# Patient Record
Sex: Male | Born: 1990 | Race: White | Hispanic: No | Marital: Single | Smoking: Never smoker
Health system: Southern US, Community
[De-identification: ages and names within clinical notes are randomized; demographics above are authoritative.]

## PROBLEM LIST (undated history)

## (undated) DIAGNOSIS — E291 Testicular hypofunction: Secondary | ICD-10-CM

## (undated) DIAGNOSIS — R7989 Other specified abnormal findings of blood chemistry: Secondary | ICD-10-CM

## (undated) DIAGNOSIS — T7840XA Allergy, unspecified, initial encounter: Secondary | ICD-10-CM

## (undated) DIAGNOSIS — K200 Eosinophilic esophagitis: Secondary | ICD-10-CM

## (undated) DIAGNOSIS — K219 Gastro-esophageal reflux disease without esophagitis: Secondary | ICD-10-CM

## (undated) DIAGNOSIS — J45909 Unspecified asthma, uncomplicated: Secondary | ICD-10-CM

## (undated) DIAGNOSIS — Z973 Presence of spectacles and contact lenses: Secondary | ICD-10-CM

## (undated) DIAGNOSIS — I10 Essential (primary) hypertension: Secondary | ICD-10-CM

## (undated) HISTORY — DX: Allergy, unspecified, initial encounter: T78.40XA

## (undated) HISTORY — DX: Essential (primary) hypertension: I10

## (undated) HISTORY — DX: Other specified abnormal findings of blood chemistry: R79.89

## (undated) HISTORY — DX: Testicular hypofunction: E29.1

## (undated) HISTORY — DX: Gastro-esophageal reflux disease without esophagitis: K21.9

## (undated) HISTORY — DX: Unspecified asthma, uncomplicated: J45.909

## (undated) HISTORY — DX: Presence of spectacles and contact lenses: Z97.3

## (undated) HISTORY — DX: Eosinophilic esophagitis: K20.0

---

## 2014-07-05 DIAGNOSIS — R7989 Other specified abnormal findings of blood chemistry: Secondary | ICD-10-CM

## 2014-07-05 HISTORY — DX: Other specified abnormal findings of blood chemistry: R79.89

## 2015-07-06 DIAGNOSIS — I10 Essential (primary) hypertension: Secondary | ICD-10-CM

## 2015-07-06 HISTORY — DX: Essential (primary) hypertension: I10

## 2015-08-12 ENCOUNTER — Emergency Department (HOSPITAL_COMMUNITY): Payer: BLUE CROSS/BLUE SHIELD

## 2015-08-12 ENCOUNTER — Encounter (HOSPITAL_COMMUNITY): Payer: Self-pay | Admitting: Cardiology

## 2015-08-12 ENCOUNTER — Emergency Department (HOSPITAL_COMMUNITY)
Admission: EM | Admit: 2015-08-12 | Discharge: 2015-08-12 | Disposition: A | Discharge status: Home / Self Care | Payer: BLUE CROSS/BLUE SHIELD | Attending: Emergency Medicine | Admitting: Emergency Medicine

## 2015-08-12 DIAGNOSIS — Z880 Allergy status to penicillin: Secondary | ICD-10-CM | POA: Insufficient documentation

## 2015-08-12 DIAGNOSIS — R002 Palpitations: Secondary | ICD-10-CM | POA: Diagnosis not present

## 2015-08-12 DIAGNOSIS — R079 Chest pain, unspecified: Secondary | ICD-10-CM | POA: Diagnosis present

## 2015-08-12 DIAGNOSIS — R200 Anesthesia of skin: Secondary | ICD-10-CM | POA: Diagnosis not present

## 2015-08-12 LAB — I-STAT TROPONIN, ED: Troponin i, poc: 0 ng/mL (ref 0.00–0.08)

## 2015-08-12 LAB — CBC
HEMATOCRIT: 52.3 % — AB (ref 39.0–52.0)
HEMOGLOBIN: 18.1 g/dL — AB (ref 13.0–17.0)
MCH: 28.9 pg (ref 26.0–34.0)
MCHC: 34.6 g/dL (ref 30.0–36.0)
MCV: 83.5 fL (ref 78.0–100.0)
Platelets: 193 10*3/uL (ref 150–400)
RBC: 6.26 MIL/uL — ABNORMAL HIGH (ref 4.22–5.81)
RDW: 12.5 % (ref 11.5–15.5)
WBC: 5.7 10*3/uL (ref 4.0–10.5)

## 2015-08-12 LAB — BASIC METABOLIC PANEL
ANION GAP: 13 (ref 5–15)
BUN: 12 mg/dL (ref 6–20)
CHLORIDE: 101 mmol/L (ref 101–111)
CO2: 26 mmol/L (ref 22–32)
Calcium: 9.9 mg/dL (ref 8.9–10.3)
Creatinine, Ser: 1.11 mg/dL (ref 0.61–1.24)
GFR calc Af Amer: >60 mL/min (ref >60)
GLUCOSE: 108 mg/dL — AB (ref 65–99)
POTASSIUM: 3.4 mmol/L — AB (ref 3.5–5.1)
SODIUM: 140 mmol/L (ref 135–145)

## 2015-08-12 MED ORDER — POTASSIUM CHLORIDE CRYS ER 20 MEQ PO TBCR
40.0000 meq | EXTENDED_RELEASE_TABLET | Freq: Once | ORAL | Status: AC
Start: 2015-08-12 — End: 2015-08-12
  Administered 2015-08-12: 40 meq via ORAL
  Filled 2015-08-12: qty 2

## 2015-08-12 NOTE — ED Notes (Signed)
Pt is in stable condition upon d/c and ambulates from ED. 

## 2015-08-12 NOTE — ED Provider Notes (Signed)
CSN: [id number]     Arrival date & time 08/12/15  4098 History   First MD Initiated Contact with Patient 08/12/15 [phone]     Chief Complaint  Patient presents with  . Chest Pain  . Numbness     (Consider location/radiation/quality/duration/timing/severity/associated sxs/prior Treatment) HPI  Pt presenting with c/o heart racing associated with chest pain.  He states he was getting ready for work this morning as usual- upon getting out of the shower he felt his heart racing. He states he became very anxious and wasn't able to talk.  He describes chest pain and radiation down arm.  He states he has had similar symptoms multiple times over the past several weeks.  No fainting.  No leg swelling.  Symptoms are not associated with exertion.  No hx of DVT/PE.  No recent travel/trauma/surgery.  There are no other associated systemic symptoms, there are no other alleviating or modifying factors.   History reviewed. No pertinent past medical history. History reviewed. No pertinent past surgical history. History reviewed. No pertinent family history. Social History  Substance Use Topics  . Smoking status: Never Smoker   . Smokeless tobacco: None  . Alcohol Use: Yes    Review of Systems  ROS reviewed and all otherwise negative except for mentioned in HPI    Allergies  Penicillins and Proton pump inhibitors  Home Medications   Prior to Admission medications   Not on File   BP 146/90 mmHg  Pulse 71  Temp(Src) 98.2 F (36.8 C) (Oral)  Resp 17  SpO2 98%  Vitals reviewed Physical Exam  Physical Examination: General appearance - alert, well appearing, and in no distress Mental status - alert, oriented to person, place, and time Eyes - no conjunctival injection, no scleral icterus Mouth - mucous membranes moist, pharynx normal without lesions Chest - clear to auscultation, no wheezes, rales or rhonchi, symmetric air entry Heart - normal rate, regular rhythm, normal S1, S2, no murmurs,  rubs, clicks or gallops Abdomen - soft, nontender, nondistended, no masses or organomegaly Neurological - alert, oriented, normal speech Extremities - peripheral pulses normal, no pedal edema, no clubbing or cyanosis Skin - normal coloration and turgor, no rashes  ED Course  Procedures (including critical care time) Labs Review Labs Reviewed  BASIC METABOLIC PANEL - Abnormal; Notable for the following:    Potassium 3.4 (*)    Glucose, Bld 108 (*)    All other components within normal limits  CBC - Abnormal; Notable for the following:    RBC 6.26 (*)    Hemoglobin 18.1 (*)    HCT 52.3 (*)    All other components within normal limits  I-STAT TROPOININ, ED    Imaging Review No results found. I have personally reviewed and evaluated these images and lab results as part of my medical decision-making.   EKG Interpretation   Date/Time:  Tuesday August 12 2015 08:40:57 EST Ventricular Rate:  80 PR Interval:  144 QRS Duration: 88 QT Interval:  368 QTC Calculation: 424 R Axis:   62 Text Interpretation:  Normal sinus rhythm Normal ECG No old tracing to  compare Confirmed by [facility]  MD, Khamille Beynon [phone]) on 08/12/2015 9:35:30 AM      MDM   Final diagnoses:  Palpitations  Chest pain, unspecified chest pain type    Pt presenting with c/o palpitations- heart racing this morning.  Workup reassuring inlcuding labs/cxr.  Pt given information for cardiology followup.  Discharged with strict return precautions.  Pt agreeable with  plan.    Jerelyn Scott, MD 08/15/15 [phone]

## 2015-08-12 NOTE — ED Notes (Signed)
Pt reports left sided chest pain and arm numbness that started a couple of days ago. Reports some SOB with the pain.

## 2015-08-12 NOTE — Discharge Instructions (Signed)
Return to the ED with any concerns including difficulty breathing, fainting, leg swelling, vomiting and not able to keep down liquids, or any other alarming symptoms

## 2015-10-08 ENCOUNTER — Encounter: Payer: Self-pay | Admitting: Medical

## 2015-10-08 ENCOUNTER — Ambulatory Visit (INDEPENDENT_AMBULATORY_CARE_PROVIDER_SITE_OTHER): Payer: 59 | Admitting: Medical

## 2015-10-08 VITALS — BP 146/108 | HR 73 | Ht 68.5 in | Wt 233.0 lb

## 2015-10-08 DIAGNOSIS — Z79899 Other long term (current) drug therapy: Secondary | ICD-10-CM | POA: Diagnosis not present

## 2015-10-08 DIAGNOSIS — E291 Testicular hypofunction: Secondary | ICD-10-CM

## 2015-10-08 DIAGNOSIS — K219 Gastro-esophageal reflux disease without esophagitis: Secondary | ICD-10-CM | POA: Diagnosis not present

## 2015-10-08 DIAGNOSIS — R030 Elevated blood-pressure reading, without diagnosis of hypertension: Secondary | ICD-10-CM

## 2015-10-08 DIAGNOSIS — R718 Other abnormality of red blood cells: Secondary | ICD-10-CM | POA: Diagnosis not present

## 2015-10-08 DIAGNOSIS — E559 Vitamin D deficiency, unspecified: Secondary | ICD-10-CM

## 2015-10-08 DIAGNOSIS — R079 Chest pain, unspecified: Secondary | ICD-10-CM

## 2015-10-08 DIAGNOSIS — R7989 Other specified abnormal findings of blood chemistry: Secondary | ICD-10-CM

## 2015-10-08 LAB — CBC WITH DIFFERENTIAL/PLATELET
BASOS ABS: 0 {cells}/uL (ref 0–200)
Basophils Relative: 0 %
EOS ABS: 212 {cells}/uL (ref 15–500)
EOS PCT: 4 %
HCT: 49 % (ref 38.5–50.0)
Hemoglobin: 16.7 g/dL (ref 13.2–17.1)
LYMPHS PCT: 34 %
Lymphs Abs: 1802 cells/uL (ref 850–3900)
MCH: 28.4 pg (ref 27.0–33.0)
MCHC: 34.1 g/dL (ref 32.0–36.0)
MCV: 83.5 fL (ref 80.0–100.0)
MONOS PCT: 8 %
MPV: 9.5 fL (ref 7.5–12.5)
Monocytes Absolute: 424 cells/uL (ref 200–950)
NEUTROS ABS: 2862 {cells}/uL (ref 1500–7800)
NEUTROS PCT: 54 %
PLATELETS: 220 10*3/uL (ref 140–400)
RBC: 5.87 MIL/uL — ABNORMAL HIGH (ref 4.20–5.80)
RDW: 13.1 % (ref 11.0–15.0)
WBC: 5.3 10*3/uL (ref 4.0–10.5)

## 2015-10-08 LAB — HEPATITIS C ANTIBODY: HCV AB: NEGATIVE

## 2015-10-08 LAB — COMPREHENSIVE METABOLIC PANEL
ALK PHOS: 89 U/L (ref 40–115)
ALT: 21 U/L (ref 9–46)
AST: 24 U/L (ref 10–40)
Albumin: 4.9 g/dL (ref 3.6–5.1)
BILIRUBIN TOTAL: 1.1 mg/dL (ref 0.2–1.2)
BUN: 19 mg/dL (ref 7–25)
CALCIUM: 9.5 mg/dL (ref 8.6–10.3)
CO2: 29 mmol/L (ref 20–31)
CREATININE: 1.08 mg/dL (ref 0.60–1.35)
Chloride: 102 mmol/L (ref 98–110)
GLUCOSE: 80 mg/dL (ref 65–99)
Potassium: 3.9 mmol/L (ref 3.5–5.3)
SODIUM: 138 mmol/L (ref 135–146)
Total Protein: 8.2 g/dL — ABNORMAL HIGH (ref 6.1–8.1)

## 2015-10-08 LAB — HIV ANTIBODY (ROUTINE TESTING W REFLEX): HIV 1&2 Ab, 4th Generation: NONREACTIVE

## 2015-10-08 LAB — TESTOSTERONE: TESTOSTERONE: 243 ng/dL — AB (ref 250–827)

## 2015-10-08 LAB — HEMOGLOBIN A1C
Hgb A1c MFr Bld: 5.6 % (ref <5.7)
Mean Plasma Glucose: 114 mg/dL

## 2015-10-08 LAB — HEPATITIS B SURFACE ANTIGEN: Hepatitis B Surface Ag: NEGATIVE

## 2015-10-08 NOTE — Progress Notes (Signed)
Subjective: Chief Complaint  Patient presents with  . New Patient (Initial Visit)    also wants to discuss vit d medicine and wants blood work is not fasting.    Here as a new patient today to establish care.  [place] in [place] to [place].  Had low Vit D at  [facility] in [place] a month ago, been on the prescription Vit D weekly, but ran out.   He originally went to urgent care for chest pain following an illness, had labs, EKG was normal, was advised he has panic attacks, anxiety, labs were done, showing low Vit D, and put on the weekly Vit D.   Since then has improvement in energy.   In recent weeks doing fine.  Is outgoing, doesn't feel like he has anxiety.  BPs have been elevated in the past, but then will be normal on recheck.  BPs can be all over the place.  Engaged, considering getting married 12/2015.  Has been together 5 years.   8 years ago had crazy girlfriend, and he ended going on antidepressants for 2 months.  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 No other aggravating or relieving factors. No other complaint.  History reviewed. No pertinent past medical history.  ROS as in subjective   Objective: BP 146/108 mmHg  Pulse 73  Ht 5' 8.5" (1.74 m)  Wt 233 lb (105.688 kg)  BMI 34.91 kg/m2  Gen: wd, wn, nad, white male Skin: warm, dry, freckles, no jaundice Oral cavity: MMM, no lesions Neck: supple, no lymphadenopathy, no thyromegaly, no masses, no bruits Heart: RRR, normal S1, S2, no murmurs Lungs: CTA bilaterally, no wheezes, rhonchi, or rales Abdomen: +bs, soft, non tender, non distended, no masses, no hepatomegaly, no splenomegaly, no bruits Pulses: 2+ symmetric, upper and lower extremities, normal cap refill Ext: no edema    Assessment: Encounter Diagnoses  Name Primary?  . Elevated blood-pressure reading without diagnosis of hypertension Yes  . Chest pain, unspecified chest pain type   . Gastroesophageal  reflux disease without esophagitis   . RBC abnormality   . Vitamin D deficiency   . High risk medication use   . Low testosterone     Plan: Elevated BPs - advised home monitoring, and return BP numbers in 2 wk.  Discussed risks of uncontrolled hpyertension Chest pain - resolved, and he does have hx/o GERD which could contribute to his pain GERD - long standing x 3+ years. C/t ranitidine, avoid trigger foods RBC abnormality in 08/2015.  Plan to repeat labs  Vit D deficiency - plan to repeat labs He has hx/o unprescribed use of TST injections over a year ago.   advised screening and labs to rule out acquired infection F/u pending labs.   Apolinar JunesBrandon was seen today for new patient (initial visit).  Diagnoses and all orders for this visit:  Elevated blood-pressure reading without diagnosis of hypertension -     Comprehensive metabolic panel -     CBC with Differential/Platelet -     Hemoglobin A1c  Chest pain, unspecified chest pain type  Gastroesophageal reflux disease without esophagitis  RBC abnormality -     CBC with Differential/Platelet  Vitamin D deficiency -     VITAMIN D 25 Hydroxy (Vit-D Deficiency, Fractures)  High risk medication use -     Comprehensive metabolic panel -     CBC with Differential/Platelet -     GC/Chlamydia Probe Amp -  HIV antibody -     RPR -     Hepatitis B surface antigen -     Hepatitis C antibody  Low testosterone -     Testosterone

## 2015-10-09 LAB — RPR

## 2015-10-09 LAB — GC/CHLAMYDIA PROBE AMP
CT Probe RNA: NOT DETECTED
GC PROBE AMP APTIMA: NOT DETECTED

## 2015-10-09 LAB — VITAMIN D 25 HYDROXY (VIT D DEFICIENCY, FRACTURES): VIT D 25 HYDROXY: 40 ng/mL (ref 30–100)

## 2015-10-14 ENCOUNTER — Encounter: Payer: Self-pay | Admitting: Medical

## 2015-10-22 ENCOUNTER — Encounter: Payer: Self-pay | Admitting: Medical

## 2015-10-22 ENCOUNTER — Ambulatory Visit (INDEPENDENT_AMBULATORY_CARE_PROVIDER_SITE_OTHER): Payer: 59 | Admitting: Medical

## 2015-10-22 VITALS — BP 130/90 | HR 75 | Wt 231.0 lb

## 2015-10-22 DIAGNOSIS — D751 Secondary polycythemia: Secondary | ICD-10-CM

## 2015-10-22 DIAGNOSIS — R7989 Other specified abnormal findings of blood chemistry: Secondary | ICD-10-CM

## 2015-10-22 DIAGNOSIS — I10 Essential (primary) hypertension: Secondary | ICD-10-CM | POA: Diagnosis not present

## 2015-10-22 DIAGNOSIS — E291 Testicular hypofunction: Secondary | ICD-10-CM | POA: Diagnosis not present

## 2015-10-22 MED ORDER — LISINOPRIL 10 MG PO TABS: 10.0000 mg | ORAL_TABLET | Freq: Every day | ORAL | Status: DC

## 2015-10-22 NOTE — Progress Notes (Signed)
Subjective: Chief Complaint  Patient presents with  . Follow-up    states bp is still high here are his readings: 139/90 - Friday 4/7 133/89 - Saturday 4/8 126/82 - Sunday 4/9  126/78 - Monday 4/10    Here to follow up from last visit.   At his recent visit here BPs were elevated.  At that visit we asked him to monitor BPs which he has been doing and getting normal to mildlhy elevated readings.  However, he does get dizzy at times, gets fatigue at times.  Thinks its related to high blood pressure.  His father was diagnosed with hypertension at age 24yo, grandfather with HTN also at an early age.  Mother has hx/o OSA   He is here to discussed the recent labs including elevated RBCs, low testosterone.  No other aggravating or relieving factors. No other complaint.  Past Medical History  Diagnosis Date  . Asthma     childhood  . Wears glasses   . Allergy     ROS as in subjective   Objective: BP 130/90 mmHg  Pulse 75  Wt 231 lb (104.781 kg)  Gen: wd, wn, nad, white male Skin: warm, dry, freckles, no jaundice Oral cavity: MMM, no lesions Neck: supple, no lymphadenopathy, no thyromegaly, no masses, no bruits Heart: RRR, normal S1, S2, no murmurs Lungs: CTA bilaterally, no wheezes, rhonchi, or rales Abdomen: +bs, soft, non tender, non distended, no masses, no hepatomegaly, no splenomegaly, no bruits Pulses: 2+ symmetric, upper and lower extremities, normal cap refill Ext: no edema    Assessment: Encounter Diagnoses  Name Primary?  . Essential hypertension Yes  . Erythrocytosis   . Low testosterone      Plan: New diagnosis of hypertension.  discussed continuing home monitoring of blood pressures, begin medication below, avoid salt, and work on weight loss theory healthy diet and exercise  erythrocytosis - recheck in 1-2 mo  Low T - discussed findings, but also defer for now until BP is under better    Peter Chase was seen today for follow-up.  Diagnoses and all orders for  this visit:  Essential hypertension  Erythrocytosis  Low testosterone  Other orders -     lisinopril (PRINIVIL,ZESTRIL) 10 MG tablet; Take 1 tablet (10 mg total) by mouth daily.

## 2015-11-05 ENCOUNTER — Telehealth: Payer: Self-pay | Admitting: Medical

## 2015-11-05 NOTE — Telephone Encounter (Signed)
Received a call from Dr. Ethelene BrownsAnthony at [facility] concerning records request we have sent per pt's instructions. They state they do not have a patient in that practice by that name. No records available.

## 2015-11-07 ENCOUNTER — Encounter: Payer: Self-pay | Admitting: Medical

## 2015-11-07 ENCOUNTER — Telehealth: Payer: Self-pay | Admitting: Medical

## 2015-11-07 NOTE — Telephone Encounter (Signed)
Pt has not been checking bp and is going to start. Stated he will call back to make appt

## 2015-11-07 NOTE — Telephone Encounter (Signed)
I received a message from him.   See if he is actually checking his BPs.  If not, needs to check BP several days per week or anytime he feels dizzy, lightheaded.   He is on a low dose of Lisinopril, but if he is seeing readings 100/60 or lower, then that is too low.  Monitor BP, and lets plan recheck in a few weeks, but bring BP readings with him.

## 2015-11-07 NOTE — Telephone Encounter (Signed)
LMTCB

## 2015-11-19 ENCOUNTER — Ambulatory Visit (INDEPENDENT_AMBULATORY_CARE_PROVIDER_SITE_OTHER): Payer: 59 | Admitting: Medical

## 2015-11-19 VITALS — BP 116/80 | HR 71 | Wt 230.0 lb

## 2015-11-19 DIAGNOSIS — D751 Secondary polycythemia: Secondary | ICD-10-CM

## 2015-11-19 DIAGNOSIS — E291 Testicular hypofunction: Secondary | ICD-10-CM

## 2015-11-19 DIAGNOSIS — R5382 Chronic fatigue, unspecified: Secondary | ICD-10-CM

## 2015-11-19 DIAGNOSIS — I10 Essential (primary) hypertension: Secondary | ICD-10-CM

## 2015-11-19 LAB — CBC WITH DIFFERENTIAL/PLATELET
Basophils Absolute: 0 cells/uL (ref 0–200)
Basophils Relative: 0 %
EOS ABS: 224 {cells}/uL (ref 15–500)
Eosinophils Relative: 4 %
HEMATOCRIT: 49.5 % (ref 38.5–50.0)
Hemoglobin: 16.9 g/dL (ref 13.2–17.1)
LYMPHS PCT: 41 %
Lymphs Abs: 2296 cells/uL (ref 850–3900)
MCH: 28.3 pg (ref 27.0–33.0)
MCHC: 34.1 g/dL (ref 32.0–36.0)
MCV: 82.9 fL (ref 80.0–100.0)
MONO ABS: 448 {cells}/uL (ref 200–950)
MPV: 9.3 fL (ref 7.5–12.5)
Monocytes Relative: 8 %
NEUTROS PCT: 47 %
Neutro Abs: 2632 cells/uL (ref 1500–7800)
Platelets: 239 10*3/uL (ref 140–400)
RBC: 5.97 MIL/uL — ABNORMAL HIGH (ref 4.20–5.80)
RDW: 13.4 % (ref 11.0–15.0)
WBC: 5.6 10*3/uL (ref 4.0–10.5)

## 2015-11-19 MED ORDER — LISINOPRIL 10 MG PO TABS: 10.0000 mg | ORAL_TABLET | Freq: Every day | ORAL | Status: DC

## 2015-11-19 NOTE — Addendum Note (Signed)
Addended by: Jac CanavanYSINGER, DAVID S on: 11/19/2015 04:24 PM   Modules accepted: Orders

## 2015-11-19 NOTE — Progress Notes (Signed)
Subjective: Chief Complaint  Patient presents with  . Follow-up    brought readings with him. states the dizziness has subsided. no problems or concerns.    Here for f/u.   I saw him about a month ago for HTN.  He had some initial dizziness, but this went away and now he is tolerating the medication fine, home BPs look great.   Compliant with medication  Wants to discuss testosterone . Has hx/o low T, was taking some friend's testosterone in remote past.  just as then he continues to have decreased energy levels.  Despite regular exercise, healthy diet, plenty of water intake, just feels drainage after exercise.  Is interested in pursuing testosterone therapy  Has elevated RBC in past labs.  Nonsmoker, no dyspnea.    Past Medical History  Diagnosis Date  . Asthma     childhood  . Wears glasses   . Allergy    ROS as in subjective   Objective: BP 116/80 mmHg  Pulse 71  Wt 230 lb (104.327 kg)  Gen: wd, wn, nad Heart: RRR, normal S1, S2, no murmurs Lungs clear Ext: no edema Pulses normal Normal male genitalia, circumcised, no mass, no testicles swelling, no lymphadenopathy DRE:  Anus normal tone, prostate WNL    Assessment: Encounter Diagnoses  Name Primary?  . Essential hypertension Yes  . Hypogonadism in male   . Chronic fatigue   . Erythrocytosis     Plan: HTN - much improved, c/t same medication  Hypogonadism, fatigue - repeat labs today, discussed diagnosis of low T, possible causes, treatment options, precautions.  Discussed this at length.  He will consider gel vs TST IM.   F/u pending labs.   erythrocytosis - repeat lab, likely transient.   F/u pending labs

## 2015-11-20 ENCOUNTER — Other Ambulatory Visit: Payer: Self-pay | Admitting: Medical

## 2015-11-20 LAB — FSH/LH
FSH: 2.9 m[IU]/mL (ref 1.6–8.0)
LH: 2.6 m[IU]/mL (ref 1.5–9.3)

## 2015-11-20 LAB — TESTOSTERONE: Testosterone: 242 ng/dL — ABNORMAL LOW (ref 250–827)

## 2015-11-20 LAB — TSH: TSH: 2.35 m[IU]/L (ref 0.40–4.50)

## 2015-11-20 LAB — PSA: PSA: 0.72 ng/mL (ref <=4.00)

## 2015-11-20 LAB — PROLACTIN: Prolactin: 7.4 ng/mL (ref 2.0–18.0)

## 2015-11-20 MED ORDER — TESTOSTERONE CYPIONATE 200 MG/ML IM SOLN: 200.0000 mg | INTRAMUSCULAR | Status: DC

## 2015-11-24 ENCOUNTER — Other Ambulatory Visit (INDEPENDENT_AMBULATORY_CARE_PROVIDER_SITE_OTHER): Payer: 59

## 2015-11-24 DIAGNOSIS — E291 Testicular hypofunction: Secondary | ICD-10-CM | POA: Diagnosis not present

## 2015-11-24 MED ORDER — TESTOSTERONE CYPIONATE 200 MG/ML IM SOLN
200.0000 mg | INTRAMUSCULAR | Status: DC
  Administered 2015-11-24: 200 mg via INTRAMUSCULAR

## 2015-11-25 ENCOUNTER — Encounter: Payer: Self-pay | Admitting: Medical

## 2015-12-05 ENCOUNTER — Other Ambulatory Visit (INDEPENDENT_AMBULATORY_CARE_PROVIDER_SITE_OTHER): Payer: 59

## 2015-12-05 DIAGNOSIS — E291 Testicular hypofunction: Secondary | ICD-10-CM

## 2015-12-05 DIAGNOSIS — R7989 Other specified abnormal findings of blood chemistry: Secondary | ICD-10-CM

## 2015-12-05 MED ORDER — "NEEDLE (DISP) 22G X 1-1/2"" MISC": 1.0000 | Status: DC

## 2015-12-05 MED ORDER — SYRINGE (DISPOSABLE) 1 ML MISC: 1.0000 | Status: DC

## 2015-12-05 MED ORDER — TESTOSTERONE CYPIONATE 200 MG/ML IM SOLN
200.0000 mg | Freq: Once | INTRAMUSCULAR | Status: AC
  Administered 2015-12-05: 200 mg via INTRAMUSCULAR

## 2015-12-09 ENCOUNTER — Ambulatory Visit (INDEPENDENT_AMBULATORY_CARE_PROVIDER_SITE_OTHER): Payer: 59 | Admitting: Medical

## 2015-12-09 ENCOUNTER — Encounter: Payer: Self-pay | Admitting: Medical

## 2015-12-09 VITALS — BP 126/80 | HR 96 | Temp 98.8°F | Wt 228.0 lb

## 2015-12-09 DIAGNOSIS — M791 Myalgia, unspecified site: Secondary | ICD-10-CM

## 2015-12-09 DIAGNOSIS — E291 Testicular hypofunction: Secondary | ICD-10-CM

## 2015-12-09 NOTE — Progress Notes (Signed)
Subjective: Chief Complaint  Patient presents with  . soreness    from TST shot in thigh. pt stated he is limping. no swelling or redness.    Here for right leg pain and soreness.  He came in Friday to work with CMA on self teaching for IM injection of testosterone at home.  Gave the injection here in right quadriceps laterally with 22 g 1.5 inch needle.  Since then has had soreness of muscle, hard to walk.  Denies redness, fever, numbness, tingling.  Today is day 5 after infection and still hurting.  ROS as in subjective  Objective: BP 126/80 mmHg  Pulse 96  Temp(Src) 98.8 F (37.1 C) (Tympanic)  Wt 228 lb (103.42 kg)  Gen: wd, wn, nad Tender over right mid vastus lateralis muscle over injection area but no induration, warmth, erythema.   Leg neurovascularly intact with normal ROM No swelling   Assessment: Encounter Diagnoses  Name Primary?  . Muscle pain Yes  . Hypogonadism in male     Plan: Advised warm compresses, OTC Ibuprofen, reassured.   Gave note for work and symptom should gradually resolve.  If not improved by Friday call back.  Advised he change to buttock upper outer 1/3 for future injections.  He voiced understanding of proper technique for home injections.   Advised f/u here for testosterone lab in 2- 3 weeks.

## 2016-01-21 ENCOUNTER — Encounter: Payer: Self-pay | Admitting: Medical

## 2016-02-05 ENCOUNTER — Encounter: Payer: Self-pay | Admitting: Medical

## 2016-02-16 ENCOUNTER — Other Ambulatory Visit (INDEPENDENT_AMBULATORY_CARE_PROVIDER_SITE_OTHER): Payer: 59

## 2016-02-16 DIAGNOSIS — R7989 Other specified abnormal findings of blood chemistry: Secondary | ICD-10-CM

## 2016-02-17 LAB — TESTOSTERONE: TESTOSTERONE: 772 ng/dL (ref 250–827)

## 2016-03-09 ENCOUNTER — Encounter: Payer: Self-pay | Admitting: Medical

## 2016-03-15 ENCOUNTER — Encounter: Payer: Self-pay | Admitting: Medical

## 2016-03-15 ENCOUNTER — Ambulatory Visit (INDEPENDENT_AMBULATORY_CARE_PROVIDER_SITE_OTHER): Payer: 59 | Admitting: Medical

## 2016-03-15 VITALS — BP 132/90 | HR 77 | Resp 16

## 2016-03-15 DIAGNOSIS — K219 Gastro-esophageal reflux disease without esophagitis: Secondary | ICD-10-CM | POA: Insufficient documentation

## 2016-03-15 DIAGNOSIS — G4710 Hypersomnia, unspecified: Secondary | ICD-10-CM

## 2016-03-15 DIAGNOSIS — R5382 Chronic fatigue, unspecified: Secondary | ICD-10-CM | POA: Diagnosis not present

## 2016-03-15 DIAGNOSIS — J309 Allergic rhinitis, unspecified: Secondary | ICD-10-CM | POA: Insufficient documentation

## 2016-03-15 DIAGNOSIS — E291 Testicular hypofunction: Secondary | ICD-10-CM | POA: Diagnosis not present

## 2016-03-15 DIAGNOSIS — R7989 Other specified abnormal findings of blood chemistry: Secondary | ICD-10-CM | POA: Insufficient documentation

## 2016-03-15 DIAGNOSIS — R400 Somnolence: Secondary | ICD-10-CM | POA: Insufficient documentation

## 2016-03-15 MED ORDER — MONTELUKAST SODIUM 10 MG PO TABS: 10.0000 mg | ORAL_TABLET | Freq: Every day | ORAL | 3 refills | Status: DC

## 2016-03-15 MED ORDER — TESTOSTERONE 50 MG/5GM (1%) TD GEL: 5.0000 g | Freq: Every day | TRANSDERMAL | 2 refills | Status: DC

## 2016-03-15 NOTE — Progress Notes (Signed)
Subjective Chief Complaint  Patient presents with  . testosterone    recheck levels. past test. inj 2 weeks ago (02/28/2016).    . Gastroesophageal Reflux    pt states he is having reflux that is disturbing his sleep.    Here for several concerns, mostly related to fatigue, low T and GERD.   Using TST injections every 14-20 days.  It does help with energy, but not so much daily, mostly the first week after injection.     Allergies- uses daily OTC zyrtec, but would like to try Singulair as this does well for his father.   Snores some, sleepy in the day, but no witnessed apnea.  Wakes up feeling mostly rested.  Mom has hx/o sleep apnea.  He has never had sleep study.  Lives with his fiance.  GERD - takes ranitidine, but still gets lots of chest discomfort and reflux, despite eating a "clean diet", avoiding triggers, taking ranitidine daily.    Past Medical History:  Diagnosis Date  . Allergy   . Asthma    childhood  . Wears glasses    Current Outpatient Prescriptions on File Prior to Visit  Medication Sig Dispense Refill  . cholecalciferol (VITAMIN D) 1000 units tablet Take 1,000 Units by mouth daily. Reported on 12/09/2015    . lisinopril (PRINIVIL,ZESTRIL) 10 MG tablet Take 1 tablet (10 mg total) by mouth daily. 90 tablet 3  . NEEDLE, DISP, 22 G (BD DISP NEEDLES) 22G X 1-1/2" MISC 1 each by Does not apply route every 14 (fourteen) days. Change needle before injection 100 each 1  . ranitidine (ZANTAC) 150 MG tablet Take 150 mg by mouth 2 (two) times daily.    . Syringe, Disposable, 1 ML MISC 1 each by Does not apply route every 14 (fourteen) days. 100 each 0   No current facility-administered medications on file prior to visit.    ROS as in subjective   Objective BP 132/90   Pulse 77   Resp 16   SpO2 99%   Gen: wd, wn, nad Heart rrr, normal s1, s2, no murmurs Lungs clear Abdomen: nontender, no mass, no organomegaly,+bs.   Assessment: Encounter Diagnoses  Name Primary?   . Low testosterone Yes  . Hypogonadism in male   . Daytime somnolence   . Allergic rhinitis, unspecified allergic rhinitis type   . Gastroesophageal reflux disease without esophagitis   . Chronic fatigue      Plan: Stop TST injections.  Change to trial of Testism gel as alternative to see if this helps energy levels better.  Can use daily multivitamin, OTC B12 supplement.   He will call insurer about sleep study to see what his copay/coverage will be.     GERD - c/t ranitidine, can use TUMS OTC.   Avoid GERD triggers, raise head of bed, avoid eating within 2 hours of bedtime.   If not improving, consider repeat consult with GI.  Last GI consult 5 years ago  Allergic rhinitis - add Singulair, c/t daily OTC antihistamine  Daytime somnolent, faitue - consider sleep study  Peter Chase was seen today for testosterone and gastroesophageal reflux.  Diagnoses and all orders for this visit:  Low testosterone  Hypogonadism in male  Daytime somnolence  Allergic rhinitis, unspecified allergic rhinitis type  Gastroesophageal reflux disease without esophagitis  Chronic fatigue  Other orders -     testosterone (TESTIM) 50 MG/5GM (1%) GEL; Place 5 g onto the skin daily. -     montelukast (SINGULAIR) 10  MG tablet; Take 1 tablet (10 mg total) by mouth at bedtime.

## 2016-03-16 ENCOUNTER — Telehealth: Payer: Self-pay | Admitting: Medical

## 2016-03-16 NOTE — Telephone Encounter (Signed)
Pt states insurance will not pay for the Testim & he wants to just continue with the Testosterone Cypionate, states it worked well for him & it was affordable

## 2016-03-16 NOTE — Telephone Encounter (Signed)
Please ask him to call insurer to see what their preferred is?  Vernona RiegerLaura, did we get a prior auth?

## 2016-03-17 NOTE — Telephone Encounter (Signed)
Called pharmacy & the pt had brought in the Rx, they ran it and ins wouldn't pay so he took it back with him.  So don't know if required P.A.  Called pt & he states he is just fine staying with the Testosterone Cypionate because it only costs $10 a month and really doesn't want the hassle of switching when this is working just fine.  He will call his insurance company and see if there is anything else covered and if so how much it would cost him.

## 2016-03-26 MED ORDER — TESTOSTERONE CYPIONATE 200 MG/ML IM SOLN: 200.0000 mg | INTRAMUSCULAR | 0 refills | Status: DC

## 2016-03-26 NOTE — Telephone Encounter (Signed)
Called Testosterone into pharmacy per Vincenza HewsShane

## 2016-03-26 NOTE — Telephone Encounter (Signed)
Ok, if agreeable, cal out the TST multi use vial

## 2016-03-26 NOTE — Telephone Encounter (Signed)
Vernona RiegerLaura can you please handle this for me

## 2016-05-09 ENCOUNTER — Encounter: Payer: Self-pay | Admitting: Medical

## 2016-07-06 ENCOUNTER — Telehealth: Payer: Self-pay | Admitting: Medical

## 2016-07-06 ENCOUNTER — Other Ambulatory Visit: Payer: Self-pay

## 2016-07-06 ENCOUNTER — Encounter: Payer: Self-pay | Admitting: Medical

## 2016-07-06 DIAGNOSIS — K219 Gastro-esophageal reflux disease without esophagitis: Secondary | ICD-10-CM

## 2016-07-06 NOTE — Telephone Encounter (Signed)
Please refer to [facility] for acid reflux ongoing, needs further eval

## 2016-07-06 NOTE — Telephone Encounter (Signed)
Sent referral 

## 2016-07-15 ENCOUNTER — Other Ambulatory Visit: Payer: Self-pay | Admitting: Medical

## 2016-08-05 HISTORY — PX: ESOPHAGOGASTRODUODENOSCOPY: SHX1529

## 2016-08-09 IMAGING — DX DG CHEST 2V
2 series · 2 of 2 positions shown · non-contrast
Comparison: None.

CLINICAL DATA: Left chest pain since this morning.

EXAM:
CHEST  2 VIEW

[chest pa]
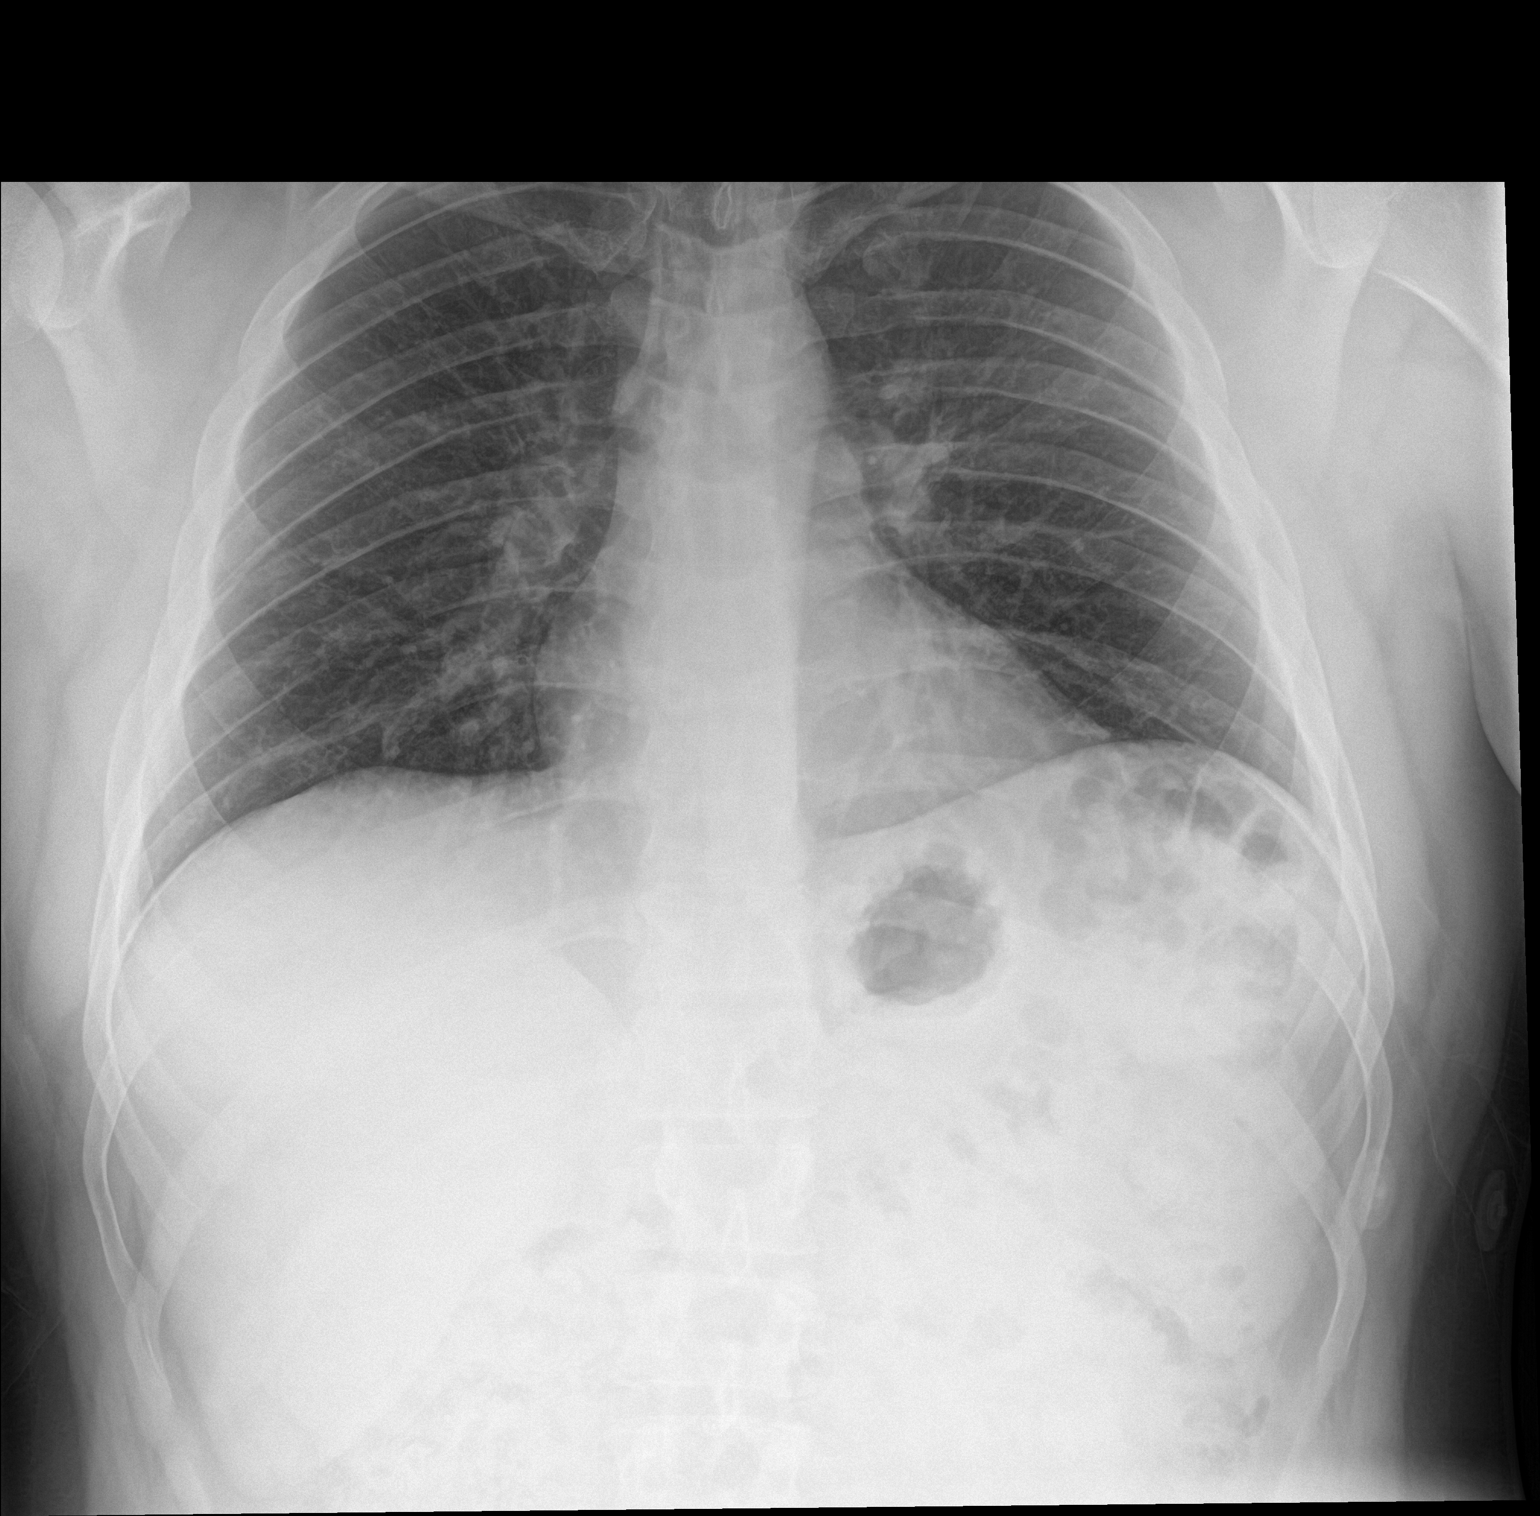

[chest lat]
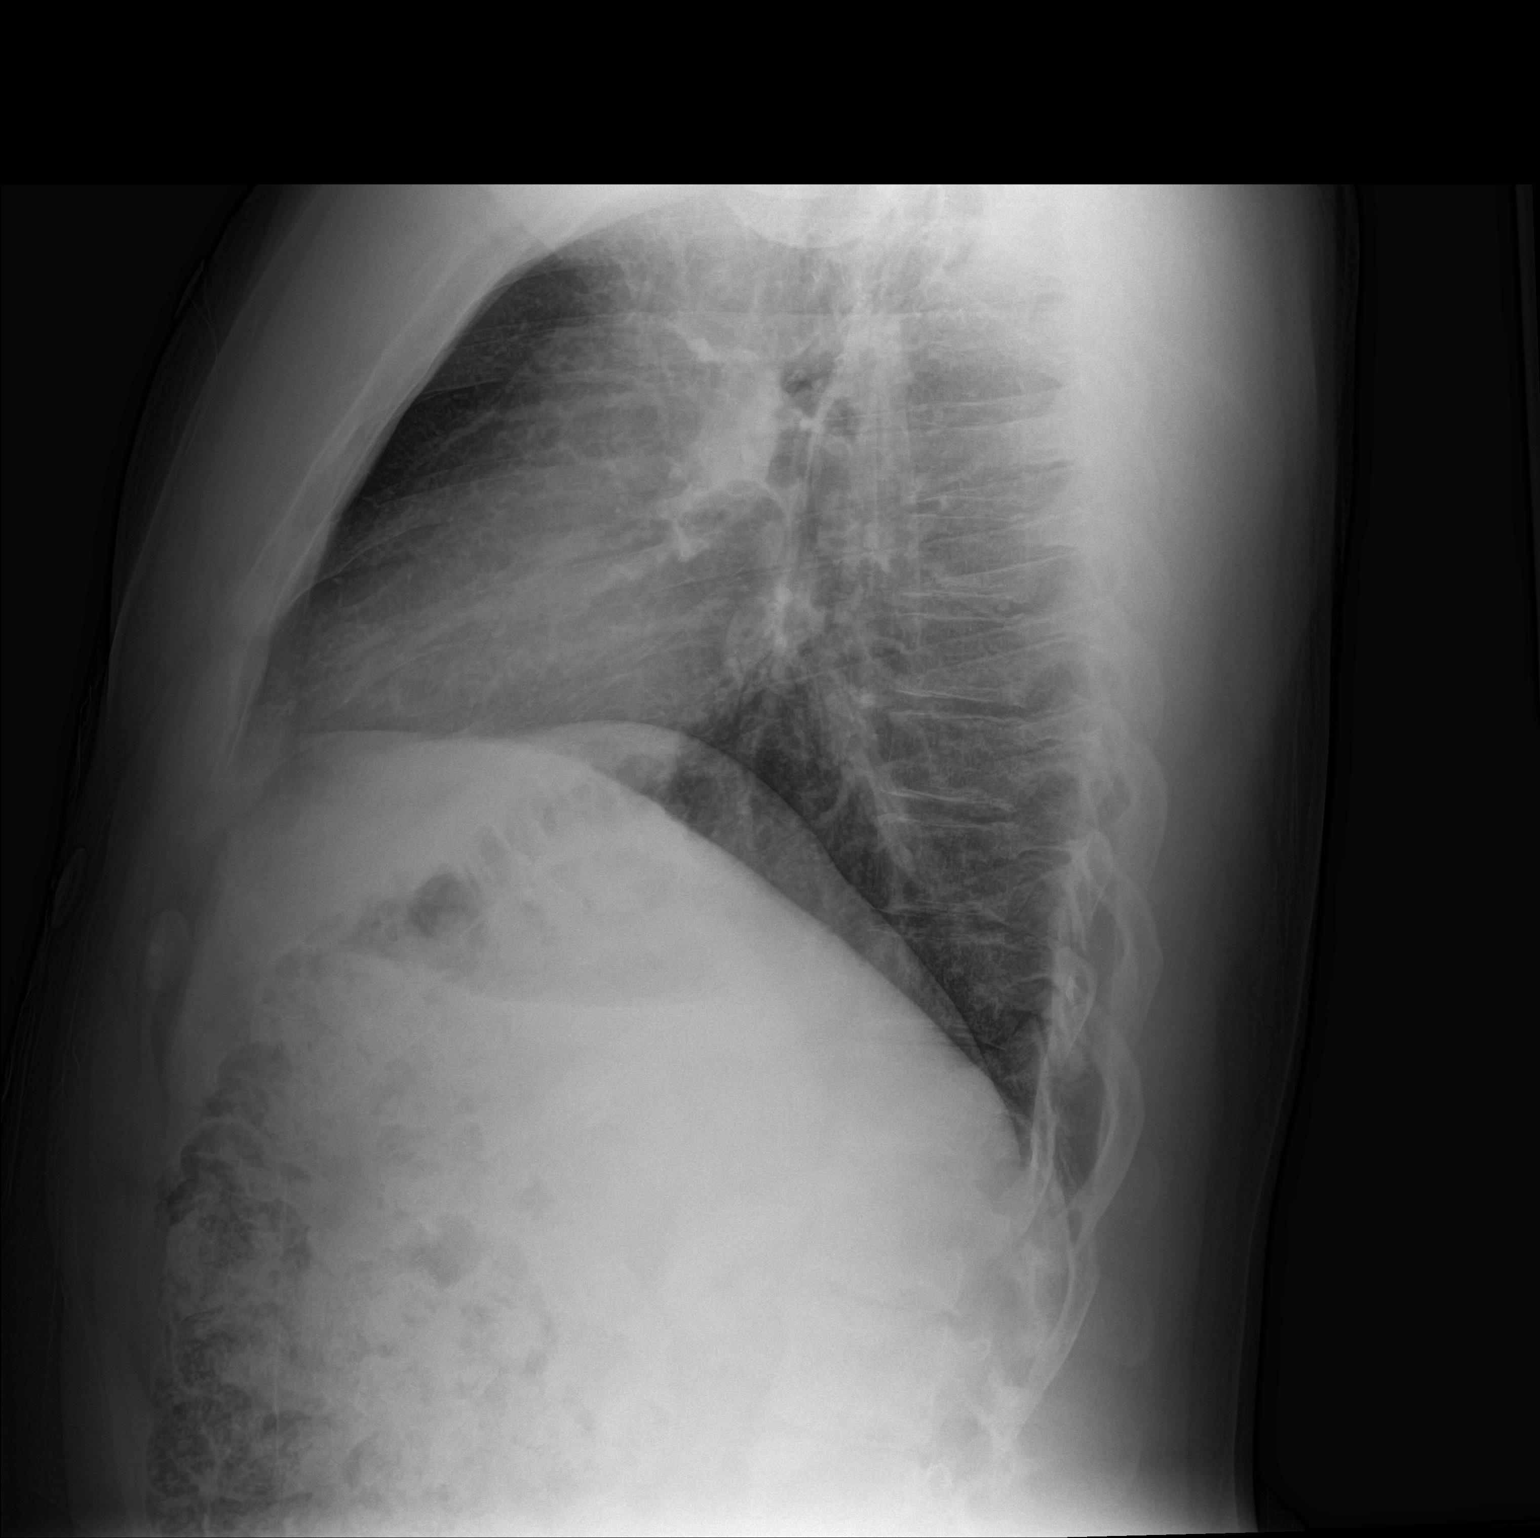

[2 of 2 positions shown; findings below may reference images not displayed]

FINDINGS: Cardiac silhouette is normal in size and configuration. Normal
mediastinal and hilar contours.

Lungs are clear.  No pleural effusion or pneumothorax.

Skeletal structures are unremarkable.
IMPRESSION: No active cardiopulmonary disease.

## 2016-09-20 ENCOUNTER — Other Ambulatory Visit: Payer: Self-pay | Admitting: Medical

## 2016-09-20 NOTE — Telephone Encounter (Signed)
Scheduled CPX

## 2016-09-20 NOTE — Telephone Encounter (Signed)
Patient called requesting testosterone refill

## 2016-09-21 NOTE — Telephone Encounter (Signed)
Called in rx to pharmacy and made an appt  For a physical

## 2016-10-16 ENCOUNTER — Other Ambulatory Visit: Payer: Self-pay | Admitting: Medical

## 2016-10-27 ENCOUNTER — Encounter: Payer: Self-pay | Admitting: Medical

## 2016-11-11 ENCOUNTER — Other Ambulatory Visit: Payer: Self-pay | Admitting: Medical

## 2016-11-11 NOTE — Telephone Encounter (Signed)
Is this okay to refill? 

## 2016-11-23 ENCOUNTER — Encounter: Payer: Self-pay | Admitting: Medical

## 2016-12-03 HISTORY — PX: NO PAST SURGERIES: SHX2092

## 2016-12-08 ENCOUNTER — Other Ambulatory Visit: Payer: Self-pay | Admitting: Medical

## 2016-12-15 ENCOUNTER — Ambulatory Visit (INDEPENDENT_AMBULATORY_CARE_PROVIDER_SITE_OTHER): Payer: 59 | Admitting: Medical

## 2016-12-15 ENCOUNTER — Encounter: Payer: Self-pay | Admitting: Medical

## 2016-12-15 VITALS — BP 120/70 | HR 77 | Wt 230.8 lb

## 2016-12-15 DIAGNOSIS — Z23 Encounter for immunization: Secondary | ICD-10-CM

## 2016-12-15 DIAGNOSIS — K219 Gastro-esophageal reflux disease without esophagitis: Secondary | ICD-10-CM | POA: Diagnosis not present

## 2016-12-15 DIAGNOSIS — K200 Eosinophilic esophagitis: Secondary | ICD-10-CM | POA: Diagnosis not present

## 2016-12-15 DIAGNOSIS — Z139 Encounter for screening, unspecified: Secondary | ICD-10-CM | POA: Diagnosis not present

## 2016-12-15 DIAGNOSIS — Z79899 Other long term (current) drug therapy: Secondary | ICD-10-CM | POA: Diagnosis not present

## 2016-12-15 DIAGNOSIS — J301 Allergic rhinitis due to pollen: Secondary | ICD-10-CM | POA: Diagnosis not present

## 2016-12-15 DIAGNOSIS — E291 Testicular hypofunction: Secondary | ICD-10-CM | POA: Diagnosis not present

## 2016-12-15 DIAGNOSIS — Z6834 Body mass index (BMI) 34.0-34.9, adult: Secondary | ICD-10-CM

## 2016-12-15 DIAGNOSIS — Z0000 Encounter for general adult medical examination without abnormal findings: Secondary | ICD-10-CM

## 2016-12-15 DIAGNOSIS — R7989 Other specified abnormal findings of blood chemistry: Secondary | ICD-10-CM | POA: Diagnosis not present

## 2016-12-15 DIAGNOSIS — E559 Vitamin D deficiency, unspecified: Secondary | ICD-10-CM | POA: Diagnosis not present

## 2016-12-15 LAB — POCT URINALYSIS DIP (PROADVANTAGE DEVICE)
BILIRUBIN UA: NEGATIVE
GLUCOSE UA: NEGATIVE mg/dL
Ketones, POC UA: NEGATIVE mg/dL
Leukocytes, UA: NEGATIVE
Nitrite, UA: NEGATIVE
Protein Ur, POC: NEGATIVE mg/dL
RBC UA: NEGATIVE
Specific Gravity, Urine: 1.015
Urobilinogen, Ur: NEGATIVE
pH, UA: 6.5 (ref 5.0–8.0)

## 2016-12-15 LAB — CBC WITH DIFFERENTIAL/PLATELET
BASOS ABS: 0 {cells}/uL (ref 0–200)
BASOS PCT: 0 %
EOS ABS: 138 {cells}/uL (ref 15–500)
Eosinophils Relative: 3 %
HEMATOCRIT: 52.1 % — AB (ref 38.5–50.0)
Hemoglobin: 17.3 g/dL — ABNORMAL HIGH (ref 13.2–17.1)
LYMPHS PCT: 31 %
Lymphs Abs: 1426 cells/uL (ref 850–3900)
MCH: 28 pg (ref 27.0–33.0)
MCHC: 33.2 g/dL (ref 32.0–36.0)
MCV: 84.4 fL (ref 80.0–100.0)
MONO ABS: 506 {cells}/uL (ref 200–950)
MPV: 10 fL (ref 7.5–12.5)
Monocytes Relative: 11 %
Neutro Abs: 2530 cells/uL (ref 1500–7800)
Neutrophils Relative %: 55 %
Platelets: 235 10*3/uL (ref 140–400)
RBC: 6.17 MIL/uL — ABNORMAL HIGH (ref 4.20–5.80)
RDW: 14.1 % (ref 11.0–15.0)
WBC: 4.6 10*3/uL (ref 4.0–10.5)

## 2016-12-15 LAB — COMPREHENSIVE METABOLIC PANEL
ALT: 18 U/L (ref 9–46)
AST: 18 U/L (ref 10–40)
Albumin: 4.8 g/dL (ref 3.6–5.1)
Alkaline Phosphatase: 77 U/L (ref 40–115)
BILIRUBIN TOTAL: 1.5 mg/dL — AB (ref 0.2–1.2)
BUN: 13 mg/dL (ref 7–25)
CHLORIDE: 103 mmol/L (ref 98–110)
CO2: 25 mmol/L (ref 20–31)
Calcium: 9.8 mg/dL (ref 8.6–10.3)
Creat: 1.01 mg/dL (ref 0.60–1.35)
GLUCOSE: 92 mg/dL (ref 65–99)
Potassium: 4.2 mmol/L (ref 3.5–5.3)
SODIUM: 139 mmol/L (ref 135–146)
Total Protein: 8 g/dL (ref 6.1–8.1)

## 2016-12-15 LAB — LIPID PANEL
CHOL/HDL RATIO: 3.8 ratio (ref <5.0)
Cholesterol: 142 mg/dL (ref <200)
HDL: 37 mg/dL — AB (ref >40)
LDL CALC: 82 mg/dL (ref <100)
Triglycerides: 117 mg/dL (ref <150)
VLDL: 23 mg/dL (ref <30)

## 2016-12-15 LAB — HEPATITIS B SURFACE ANTIBODY, QUANTITATIVE: HEPATITIS B-POST: 28.2 m[IU]/mL

## 2016-12-15 MED ORDER — MONTELUKAST SODIUM 10 MG PO TABS: ORAL_TABLET | ORAL | 3 refills | Status: DC

## 2016-12-15 MED ORDER — PANTOPRAZOLE SODIUM 40 MG PO TBEC: DELAYED_RELEASE_TABLET | ORAL | 0 refills | Status: DC

## 2016-12-15 MED ORDER — LISINOPRIL 10 MG PO TABS: ORAL_TABLET | ORAL | 3 refills | Status: DC

## 2016-12-15 MED ORDER — "NEEDLE (DISP) 22G X 1-1/2"" MISC": 1.0000 | 1 refills | Status: DC

## 2016-12-15 MED ORDER — SYRINGE (DISPOSABLE) 1 ML MISC: 1.0000 | 0 refills | Status: DC

## 2016-12-15 NOTE — Addendum Note (Signed)
Addended by: Winn JockVALENTINE, Osmin Welz N on: 12/15/2016 02:09 PM   Modules accepted: Orders

## 2016-12-15 NOTE — Addendum Note (Signed)
Addended by: Winn JockVALENTINE, Crue Otero N on: 12/15/2016 09:29 AM   Modules accepted: Orders

## 2016-12-15 NOTE — Progress Notes (Signed)
Subjective:   HPI  Peter Chase is a 26 y.o. male who presents for Chief Complaint  Patient presents with  . Annual Exam    physical , wants to discuss meds from gasto,    Medical care team includes: Tysinger, Kermit Balo, PA-C here for primary care Dentist Eye doctor [facility]  Concerns: Doing well.  Got married May 2018.   Wife in school and will finish in 1.5 years at [employer] for radiation therapy.  Currently still using TST injection every 2 weeks.    Saw GI 08/2016, EGD showed eosinophilic esophagitis  Reviewed their medical, surgical, family, social, medication, and allergy history and updated chart as appropriate.  Past Medical History:  Diagnosis Date  . Allergy   . Asthma    childhood  . Eosinophilic esophagitis    [facility] 08/2016  . GERD (gastroesophageal reflux disease)   . Hypertension 2017  . Hypogonadism in male   . Low testosterone 2016  . Wears glasses     Past Surgical History:  Procedure Laterality Date  . ESOPHAGOGASTRODUODENOSCOPY  08/2016  . NO PAST SURGERIES  12/2016    Social History   Social History  . Marital status: Single    Spouse name: N/A  . Number of children: N/A  . Years of education: N/A   Occupational History  . Not on file.   Social History Main Topics  . Smoking status: Never Smoker  . Smokeless tobacco: Former Neurosurgeon  . Alcohol use Yes     Comment: 1 beer on weekend  . Drug use: No  . Sexual activity: Not on file   Other Topics Concern  . Not on file   Social History Narrative   Married 11/2016.   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  12/2016    Family History  Problem Relation Age of Onset  . Hypertension Father   . Heart disease Neg Hx   . Stroke Neg Hx      Current Outpatient Prescriptions:  .  cholecalciferol (VITAMIN D) 1000 units tablet, Take 1,000 Units by mouth daily. Reported on 12/09/2015, Disp: , Rfl:  .  lisinopril (PRINIVIL,ZESTRIL) 10 MG tablet, TAKE 1  TABLET(10 MG) BY MOUTH DAILY, Disp: 90 tablet, Rfl: 3 .  montelukast (SINGULAIR) 10 MG tablet, TAKE 1 TABLET(10 MG) BY MOUTH AT BEDTIME, Disp: 90 tablet, Rfl: 3 .  NEEDLE, DISP, 22 G (BD DISP NEEDLES) 22G X 1-1/2" MISC, 1 each by Does not apply route every 14 (fourteen) days. Change needle before injection, Disp: 100 each, Rfl: 1 .  ranitidine (ZANTAC) 150 MG tablet, Take 150 mg by mouth 2 (two) times daily., Disp: , Rfl:  .  Syringe, Disposable, 1 ML MISC, 1 each by Does not apply route every 14 (fourteen) days., Disp: 100 each, Rfl: 0 .  testosterone cypionate (DEPOTESTOSTERONE CYPIONATE) 200 MG/ML injection, INJECT 1 ML INTO THE MUSCLE EVERY 14 DAYS, Disp: 10 mL, Rfl: 2 .  pantoprazole (PROTONIX) 40 MG tablet, 1 tablet daily po 45 min prior to breakfast, Disp: 30 tablet, Rfl: 0  Allergies  Allergen Reactions  . Omeprazole     Face swelling.  Tolerates Ranitidine  . Penicillins   . Proton Pump Inhibitors     Review of Systems Constitutional: -fever, -chills, -sweats, -unexpected weight change, -decreased appetite, -fatigue Allergy: -sneezing, -itching, -congestion Dermatology: -changing moles, --rash, -lumps ENT: -runny nose, -ear pain, -sore throat, -hoarseness, -sinus pain, -teeth pain, - ringing in ears, -hearing loss, -nosebleeds Cardiology: -  chest pain, -palpitations, -swelling, -difficulty breathing when lying flat, -waking up short of breath Respiratory: -cough, -shortness of breath, -difficulty breathing with exercise or exertion, -wheezing, -coughing up blood Gastroenterology: +abdominal pain, -nausea, -vomiting, -diarrhea, -constipation, -blood in stool, -changes in bowel movement, -difficulty swallowing or eating Hematology: -bleeding, -bruising  Musculoskeletal: -joint aches, -muscle aches, -joint swelling, -back pain, -neck pain, -cramping, -changes in gait Ophthalmology: denies vision changes, eye redness, itching, discharge Urology: -burning with urination, -difficulty  urinating, -blood in urine, -urinary frequency, -urgency, -incontinence Neurology: -headache, -weakness, -tingling, -numbness, -memory loss, -falls, -dizziness Psychology: -depressed mood, -agitation, -sleep problems     Objective:   BP 120/70   Pulse 77   Wt 230 lb 12.8 oz (104.7 kg)   SpO2 98%   BMI 34.58 kg/m   General appearance: alert, no distress, WD/WN, Caucasian male, muscular Skin: freckles throughout, no worrisome lesions HEENT: normocephalic, conjunctiva/corneas normal, sclerae anicteric, PERRLA, EOMi, nares patent, no discharge or erythema, pharynx normal Oral cavity: MMM, tongue normal, teeth normal Neck: supple, no lymphadenopathy, no thyromegaly, no masses, normal ROM, no bruits Chest: non tender, normal shape and expansion Heart: RRR, normal S1, S2, no murmurs Lungs: CTA bilaterally, no wheezes, rhonchi, or rales Abdomen: +bs, soft, non tender, non distended, no masses, no hepatomegaly, no splenomegaly, no bruits Back: non tender, normal ROM, no scoliosis Musculoskeletal: upper extremities non tender, no obvious deformity, normal ROM throughout, lower extremities non tender, no obvious deformity, normal ROM throughout Extremities: no edema, no cyanosis, no clubbing Pulses: 2+ symmetric, upper and lower extremities, normal cap refill Neurological: alert, oriented x 3, CN2-12 intact, strength normal upper extremities and lower extremities, sensation normal throughout, DTRs 2+ throughout, no cerebellar signs, gait normal Psychiatric: normal affect, behavior normal, pleasant  GU: normal male external genitalia,circumcised, nontender, no masses, no hernia, no lymphadenopathy Rectal: deferred   Assessment and Plan :    Encounter Diagnoses  Name Primary?  . Encounter for health maintenance examination in adult Yes  . Gastroesophageal reflux disease without esophagitis   . Allergic rhinitis due to pollen, unspecified seasonality   . Hypogonadism in male   . Low  testosterone   . BMI 34.0-34.9,adult   . High risk medication use   . Eosinophilic esophagitis   . Vitamin D deficiency   . Screening for condition   . Need for Td vaccine     Physical exam - discussed and counseled on healthy lifestyle, diet, exercise, preventative care, vaccinations, sick and well care, proper use of emergency dept and after hours care, and addressed their concerns.    Health screening: See your eye doctor yearly for routine vision care. See your dentist yearly for routine dental care including hygiene visits twice yearly.  Cancer screening Discussed monthly self testicular exams PSA lab given TST therapy  Vaccinations: Counseled on the following vaccines:  influenza, hepatitis A and Hep B, Td. He will get me copy of vaccine records Counseled on the Td (tetanus, diptheria) vaccine.  Vaccine information sheet given. Td vaccine given after consent obtained. He works as a Nutritional therapist so recommended verifying Hep A series and titer today for Hep B antibody  Acute issues discussed: none  Separate significant chronic issues discussed: Low TST, hypogonadism - he continues injections at home q2 wk.   Lab today but will likely need to chagne to monthly injections Doing fine on current allergy medication Reviewed 2018 GI notes.   He will use trial of pantoprazole as recommended by GI, but start every other day given  prior adverse reaction to omeprazole.  Vit D deficiency - lab today  Apolinar JunesBrandon was seen today for annual exam.  Diagnoses and all orders for this visit:  Encounter for health maintenance examination in adult -     Comprehensive metabolic panel -     CBC with Differential/Platelet -     Lipid panel -     Hemoglobin A1c -     PSA -     Testosterone -     VITAMIN D 25 Hydroxy (Vit-D Deficiency, Fractures) -     Hepatitis B surface antibody  Gastroesophageal reflux disease without esophagitis  Allergic rhinitis due to pollen, unspecified  seasonality  Hypogonadism in male -     Comprehensive metabolic panel -     CBC with Differential/Platelet -     PSA -     Testosterone  Low testosterone -     Comprehensive metabolic panel -     CBC with Differential/Platelet -     PSA -     Testosterone  BMI 34.0-34.9,adult -     Lipid panel -     Hemoglobin A1c  High risk medication use  Eosinophilic esophagitis  Vitamin D deficiency -     VITAMIN D 25 Hydroxy (Vit-D Deficiency, Fractures)  Screening for condition -     Hepatitis B surface antibody  Need for Td vaccine  Other orders -     pantoprazole (PROTONIX) 40 MG tablet; 1 tablet daily po 45 min prior to breakfast -     montelukast (SINGULAIR) 10 MG tablet; TAKE 1 TABLET(10 MG) BY MOUTH AT BEDTIME -     lisinopril (PRINIVIL,ZESTRIL) 10 MG tablet; TAKE 1 TABLET(10 MG) BY MOUTH DAILY -     NEEDLE, DISP, 22 G (BD DISP NEEDLES) 22G X 1-1/2" MISC; 1 each by Does not apply route every 14 (fourteen) days. Change needle before injection -     Syringe, Disposable, 1 ML MISC; 1 each by Does not apply route every 14 (fourteen) days.  Follow-up pending labs, yearly for physical

## 2016-12-16 LAB — VITAMIN D 25 HYDROXY (VIT D DEFICIENCY, FRACTURES): Vit D, 25-Hydroxy: 38 ng/mL (ref 30–100)

## 2016-12-16 LAB — HEMOGLOBIN A1C
HEMOGLOBIN A1C: 5.3 % (ref <5.7)
MEAN PLASMA GLUCOSE: 105 mg/dL

## 2016-12-16 LAB — PSA: PSA: 0.8 ng/mL (ref <=4.0)

## 2016-12-16 LAB — TESTOSTERONE: TESTOSTERONE: 670 ng/dL (ref 250–827)

## 2017-04-08 ENCOUNTER — Other Ambulatory Visit: Payer: Self-pay | Admitting: Medical

## 2017-04-08 NOTE — Telephone Encounter (Signed)
Called  Spoke with pt to let him know that he is due for med check to have his tst level check. Pt said that he will have to call back on Monday to made an appt. Is this okay to refill

## 2017-04-08 NOTE — Telephone Encounter (Signed)
Pt needs refill testosterone cypionate

## 2017-04-08 NOTE — Telephone Encounter (Signed)
Call in for injection ever 3 weeks, not every 2 weeks, no refill.   Have him come in for lab 3 wk from now.

## 2017-04-12 ENCOUNTER — Encounter: Payer: Self-pay | Admitting: Medical

## 2017-04-13 ENCOUNTER — Telehealth: Payer: Self-pay | Admitting: Medical

## 2017-04-13 NOTE — Telephone Encounter (Signed)
Called and spoke with pt and made appt for follow up.

## 2017-04-13 NOTE — Telephone Encounter (Signed)
Pt called and was wanting to know about the steps he needs to take, if you could please let him know pt can be reached at [phone]

## 2017-04-13 NOTE — Telephone Encounter (Signed)
I got his e-mail.   Have him cut the lisinopril in 1/2 and take 1/2 tablet daily, check BPs 3-4 times per week and record these, and f/u in 10-14 days, office recheck on BP, weight, medications.

## 2017-04-19 ENCOUNTER — Encounter: Payer: Self-pay | Admitting: Medical

## 2017-04-20 ENCOUNTER — Encounter: Payer: Self-pay | Admitting: Medical

## 2017-04-26 ENCOUNTER — Ambulatory Visit (INDEPENDENT_AMBULATORY_CARE_PROVIDER_SITE_OTHER): Payer: 59 | Admitting: Medical

## 2017-04-26 ENCOUNTER — Encounter: Payer: Self-pay | Admitting: Medical

## 2017-04-26 VITALS — BP 134/82 | HR 77 | Wt 224.8 lb

## 2017-04-26 DIAGNOSIS — E291 Testicular hypofunction: Secondary | ICD-10-CM | POA: Diagnosis not present

## 2017-04-26 DIAGNOSIS — L989 Disorder of the skin and subcutaneous tissue, unspecified: Secondary | ICD-10-CM | POA: Insufficient documentation

## 2017-04-26 DIAGNOSIS — I10 Essential (primary) hypertension: Secondary | ICD-10-CM | POA: Insufficient documentation

## 2017-04-26 DIAGNOSIS — R7989 Other specified abnormal findings of blood chemistry: Secondary | ICD-10-CM | POA: Diagnosis not present

## 2017-04-26 DIAGNOSIS — R21 Rash and other nonspecific skin eruption: Secondary | ICD-10-CM | POA: Diagnosis not present

## 2017-04-26 DIAGNOSIS — Z2821 Immunization not carried out because of patient refusal: Secondary | ICD-10-CM | POA: Diagnosis not present

## 2017-04-26 DIAGNOSIS — T7840XA Allergy, unspecified, initial encounter: Secondary | ICD-10-CM | POA: Insufficient documentation

## 2017-04-26 MED ORDER — LISINOPRIL 5 MG PO TABS: 5.0000 mg | ORAL_TABLET | Freq: Every day | ORAL | 3 refills | Status: DC

## 2017-04-26 NOTE — Progress Notes (Signed)
Subjective Chief Complaint  Patient presents with  . med check    med check discuss come off of b/p meds    Here for f/u on hypogonadism, low T.   Using TST injections every 3 wk.   It does help with energy, no c/o  May have had rash reaction to shrimp recently.  No prior similar reaction  HTN - been monitoring Bps, mostly 120/70 readings since he has cut Lisinopril in 1/2 to 5mg  daily.   He was getting low readings in recent weeks prior to him calling in about the med change.  He recently changed jobs. Working for [employer] and going to real estate school.     Past Medical History:  Diagnosis Date  . Allergy   . Asthma    childhood  . Eosinophilic esophagitis    [facility] 08/2016  . GERD (gastroesophageal reflux disease)   . Hypertension 2017  . Hypogonadism in male   . Low testosterone 2016  . Wears glasses    Current Outpatient Prescriptions on File Prior to Visit  Medication Sig Dispense Refill  . cholecalciferol (VITAMIN D) 1000 units tablet Take 1,000 Units by mouth daily. Reported on 12/09/2015    . lisinopril (PRINIVIL,ZESTRIL) 10 MG tablet TAKE 1 TABLET(10 MG) BY MOUTH DAILY 90 tablet 3  . montelukast (SINGULAIR) 10 MG tablet TAKE 1 TABLET(10 MG) BY MOUTH AT BEDTIME 90 tablet 3  . NEEDLE, DISP, 22 G (BD DISP NEEDLES) 22G X 1-1/2" MISC 1 each by Does not apply route every 14 (fourteen) days. Change needle before injection 100 each 1  . ranitidine (ZANTAC) 150 MG tablet Take 150 mg by mouth 2 (two) times daily.    . Syringe, Disposable, 1 ML MISC 1 each by Does not apply route every 14 (fourteen) days. 100 each 0  . testosterone cypionate (DEPOTESTOSTERONE CYPIONATE) 200 MG/ML injection INJECT 1 ML INTO THE MUSCLE EVERY 14 DAYS 10 mL 0   No current facility-administered medications on file prior to visit.    ROS as in subjective   Objective BP 134/82   Pulse 77   Wt 224 lb 12.8 oz (102 kg)   SpO2 98%   BMI 33.68 kg/m   Gen: wd, wn, nad Heart rrr, normal s1, s2, no  murmurs Lungs clear Abdomen: nontender, no mass, no organomegaly,+bs. Ext: no edema Pulses 2+ Skin: no rash    Assessment: Encounter Diagnoses  Name Primary?  . Low testosterone Yes  . Hypogonadism in male   . Essential hypertension, benign   . Rash   . Allergic reaction, initial encounter   . Influenza vaccination declined      Plan: C/t TST injections, labs today.   Will likely c/t q3wk injections  HTN - change to lower dose of Lisinopril 5mg  daily, monitor BPs, call in BP readings  In 6 weeks  Rash, allergic reaction possibly to shrimp.   He will avoid shrimp, but if desired can do allergy testing.  He will let me know.  Peter Chase was seen today for med check.  Diagnoses and all orders for this visit:  Low testosterone -     CBC -     Testosterone  Hypogonadism in male -     CBC  Essential hypertension, benign -     lisinopril (PRINIVIL,ZESTRIL) 5 MG tablet; Take 1 tablet (5 mg total) by mouth daily. -     CBC -     Comprehensive metabolic panel  Rash  Allergic reaction, initial  encounter  Influenza vaccination declined

## 2017-04-27 LAB — COMPREHENSIVE METABOLIC PANEL
AG Ratio: 1.5 (calc) (ref 1.0–2.5)
ALBUMIN MSPROF: 4.6 g/dL (ref 3.6–5.1)
ALT: 13 U/L (ref 9–46)
AST: 17 U/L (ref 10–40)
Alkaline phosphatase (APISO): 73 U/L (ref 40–115)
BILIRUBIN TOTAL: 1.3 mg/dL — AB (ref 0.2–1.2)
BUN: 13 mg/dL (ref 7–25)
CO2: 25 mmol/L (ref 20–32)
CREATININE: 1.05 mg/dL (ref 0.60–1.35)
Calcium: 9.1 mg/dL (ref 8.6–10.3)
Chloride: 103 mmol/L (ref 98–110)
Globulin: 3 g/dL (calc) (ref 1.9–3.7)
Glucose, Bld: 87 mg/dL (ref 65–99)
POTASSIUM: 3.8 mmol/L (ref 3.5–5.3)
SODIUM: 138 mmol/L (ref 135–146)
TOTAL PROTEIN: 7.6 g/dL (ref 6.1–8.1)

## 2017-04-27 LAB — CBC
HEMATOCRIT: 50 % (ref 38.5–50.0)
Hemoglobin: 16.7 g/dL (ref 13.2–17.1)
MCH: 27.9 pg (ref 27.0–33.0)
MCHC: 33.4 g/dL (ref 32.0–36.0)
MCV: 83.6 fL (ref 80.0–100.0)
MPV: 10 fL (ref 7.5–12.5)
PLATELETS: 239 10*3/uL (ref 140–400)
RBC: 5.98 10*6/uL — AB (ref 4.20–5.80)
RDW: 12.6 % (ref 11.0–15.0)
WBC: 5.9 10*3/uL (ref 3.8–10.8)

## 2017-04-27 LAB — TESTOSTERONE: TESTOSTERONE: 712 ng/dL (ref 250–827)

## 2017-05-11 ENCOUNTER — Encounter: Payer: Self-pay | Admitting: Medical

## 2017-09-26 ENCOUNTER — Other Ambulatory Visit: Payer: Self-pay | Admitting: Medical

## 2017-09-26 NOTE — Telephone Encounter (Signed)
Please advise if okay to submit?  Thanks!

## 2017-11-17 ENCOUNTER — Encounter: Payer: Self-pay | Admitting: Medical

## 2017-12-29 ENCOUNTER — Ambulatory Visit: Payer: No Typology Code available for payment source | Admitting: Medical

## 2017-12-29 VITALS — BP 120/90 | HR 72 | Temp 98.1°F | Resp 16 | Ht 69.0 in | Wt 229.2 lb

## 2017-12-29 DIAGNOSIS — Z79899 Other long term (current) drug therapy: Secondary | ICD-10-CM

## 2017-12-29 DIAGNOSIS — K200 Eosinophilic esophagitis: Secondary | ICD-10-CM | POA: Diagnosis not present

## 2017-12-29 DIAGNOSIS — E291 Testicular hypofunction: Secondary | ICD-10-CM | POA: Diagnosis not present

## 2017-12-29 DIAGNOSIS — Z91018 Allergy to other foods: Secondary | ICD-10-CM | POA: Diagnosis not present

## 2017-12-29 DIAGNOSIS — R7989 Other specified abnormal findings of blood chemistry: Secondary | ICD-10-CM

## 2017-12-29 NOTE — Progress Notes (Signed)
Subjective: Chief Complaint  Patient presents with  . med check    non fasting blood work    Here for recheck on testosterone therapy.  He continues to do self injections at home every 3 weeks without complaint.  Here for routine follow-up of labs  He wonders about possible allergy to fish or shrimp, wants allergy testing.  He wants some general food allergy test as well  He and wife are considering moving to [place] or [place]  Past Medical History:  Diagnosis Date  . Allergy   . Asthma    childhood  . Eosinophilic esophagitis    [facility] 08/2016  . GERD (gastroesophageal reflux disease)   . Hypertension 2017  . Hypogonadism in male   . Low testosterone 2016  . Wears glasses    Current Outpatient Medications on File Prior to Visit  Medication Sig Dispense Refill  . cholecalciferol (VITAMIN D) 1000 units tablet Take 1,000 Units by mouth daily. Reported on 12/09/2015    . montelukast (SINGULAIR) 10 MG tablet TAKE 1 TABLET(10 MG) BY MOUTH AT BEDTIME 90 tablet 3  . NEEDLE, DISP, 22 G (BD DISP NEEDLES) 22G X 1-1/2" MISC 1 each by Does not apply route every 14 (fourteen) days. Change needle before injection 100 each 1  . ranitidine (ZANTAC) 150 MG tablet Take 150 mg by mouth 2 (two) times daily.    . Syringe, Disposable, 1 ML MISC 1 each by Does not apply route every 14 (fourteen) days. 100 each 0  . testosterone cypionate (DEPOTESTOSTERONE CYPIONATE) 200 MG/ML injection INJECT 1 ML INTO THE MUSCLE EVERY 14 DAYS 10 mL 0   No current facility-administered medications on file prior to visit.    ROS as in subjective   Objective: BP 130/90   Pulse 72   Temp 98.1 F (36.7 C) (Oral)   Resp 16   Ht 5\' 9"  (1.753 m)   Wt 229 lb 3.2 oz (104 kg)   SpO2 97%   BMI 33.85 kg/m   Wt Readings from Last 3 Encounters:  12/29/17 229 lb 3.2 oz (104 kg)  04/26/17 224 lb 12.8 oz (102 kg)  12/15/16 230 lb 12.8 oz (104.7 kg)   BP Readings from Last 3 Encounters:  12/29/17 130/90  04/26/17  134/82  12/15/16 120/70   General appearance: alert, no distress, WD/WN,  Neck: supple, no lymphadenopathy, no thyromegaly, no masses Heart: RRR, normal S1, S2, no murmurs Lungs: CTA bilaterally, no wheezes, rhonchi, or rales Abdomen: +bs, soft, non tender, non distended, no masses, no hepatomegaly, no splenomegaly Pulses: 2+ symmetric, upper and lower extremities, normal cap refill Ext: no edema    Assessment Encounter Diagnoses  Name Primary?  . Low testosterone Yes  . Food allergy   . Eosinophilic esophagitis   . Hypogonadism in male   . High risk medication use     Plan: Low testosterone, hypogonadism-continue testosterone therapy, labs today  Food allergy, history of eosinophilic esophagitis- food allergy testing today  Mickie was seen today for med check.  Diagnoses and all orders for this visit:  Low testosterone -     Testosterone -     PSA  Food allergy -     Allergy Panel 19, [employer] -     Allergen food profile specific IgE -     Chicken IgE -     Beef IgE -     Allergen, Pork, f26  Eosinophilic esophagitis -     Allergy Panel 19, [employer] -  Allergen food profile specific IgE -     Chicken IgE -     Beef IgE -     Allergen, Pork, f26  Hypogonadism in male -     Testosterone -     PSA  High risk medication use -     Testosterone -     PSA

## 2018-01-02 ENCOUNTER — Encounter: Payer: Self-pay | Admitting: Medical

## 2018-01-02 LAB — ALLERGY PANEL 19, SEAFOOD GROUP
Allergen Salmon IgE: <0.1 kU/L
F023-IgE Crab: 0.12 kU/L — AB
F080-IgE Lobster: <0.1 kU/L

## 2018-01-02 LAB — ALLERGEN FOOD PROFILE SPECIFIC IGE
ALLERGEN CORN, IGE: 0.37 kU/L — AB
Allergen Apple, IgE: 0.17 kU/L — AB
Allergen Tomato, IgE: 0.41 kU/L — AB
Chicken IgE: <0.1 kU/L
Egg White IgE: 0.14 kU/L — AB
IgE (Immunoglobulin E), Serum: 225 IU/mL (ref 6–495)
Milk IgE: <0.1 kU/L
PEANUT IGE: 0.4 kU/L — AB
Shrimp IgE: 2.36 kU/L — AB
Soybean IgE: 0.29 kU/L — AB
Tuna: <0.1 kU/L
Wheat IgE: 0.12 kU/L — AB

## 2018-01-02 LAB — ALLERGEN, PORK, F26

## 2018-01-02 LAB — PSA: PROSTATE SPECIFIC AG, SERUM: 0.7 ng/mL (ref 0.0–4.0)

## 2018-01-02 LAB — TESTOSTERONE: TESTOSTERONE: 181 ng/dL — AB (ref 264–916)

## 2018-01-02 LAB — ALLERGEN BEEF: Beef IgE: <0.1 kU/L

## 2018-01-03 NOTE — Addendum Note (Signed)
Addended by: Derinda LateLAMPART, Hudsyn Barich G on: 01/03/2018 10:08 AM   Modules accepted: Orders

## 2018-01-28 ENCOUNTER — Other Ambulatory Visit: Payer: Self-pay | Admitting: Medical

## 2018-01-30 ENCOUNTER — Encounter: Payer: Self-pay | Admitting: Medical

## 2018-01-30 ENCOUNTER — Other Ambulatory Visit: Payer: Self-pay | Admitting: Medical

## 2018-01-30 MED ORDER — MONTELUKAST SODIUM 10 MG PO TABS: 10.0000 mg | ORAL_TABLET | Freq: Every day | ORAL | 3 refills | Status: DC

## 2018-04-11 ENCOUNTER — Other Ambulatory Visit: Payer: Self-pay | Admitting: Medical

## 2018-04-11 NOTE — Telephone Encounter (Signed)
Is this ok to refill?  

## 2018-05-09 ENCOUNTER — Other Ambulatory Visit: Payer: Self-pay | Admitting: Medical

## 2018-05-10 ENCOUNTER — Other Ambulatory Visit: Payer: Self-pay | Admitting: Medical

## 2018-05-10 MED ORDER — MONTELUKAST SODIUM 10 MG PO TABS: 10.0000 mg | ORAL_TABLET | Freq: Every day | ORAL | 3 refills | Status: DC

## 2018-05-12 ENCOUNTER — Other Ambulatory Visit: Payer: Self-pay | Admitting: Medical

## 2018-05-12 NOTE — Telephone Encounter (Signed)
Approve refill but schedule for physical.   Last labs and visit related to BP was last fall

## 2018-05-12 NOTE — Telephone Encounter (Signed)
Is this ok to refill?  

## 2018-05-12 NOTE — Telephone Encounter (Signed)
Left message on voicemail for patient to call back. 

## 2018-05-15 NOTE — Telephone Encounter (Signed)
Left message on voicemail for patient to call back to schedule appointment.  

## 2018-08-24 ENCOUNTER — Telehealth: Payer: Self-pay | Admitting: Medical

## 2018-08-24 NOTE — Telephone Encounter (Signed)
He emailed me about some questions with asthma.  Is been over a year since his last physical and routine labs.  I would recommend setting him up for a physical and fasting labs.  Also, in reference to his question about breathing and asthma and allergies, he is due for a pulmonary function test which would help [place] evaluate whether or not he would benefit from a daily preventative inhaler which would probably help his symptoms he was asking about in terms of exercise

## 2018-08-25 NOTE — Telephone Encounter (Signed)
I have replied to patient mychart message he originally sent

## 2018-11-06 ENCOUNTER — Other Ambulatory Visit: Payer: Self-pay | Admitting: Medical

## 2018-11-06 MED ORDER — TESTOSTERONE CYPIONATE 200 MG/ML IM SOLN: INTRAMUSCULAR | 0 refills | Status: DC

## 2018-11-06 MED ORDER — "NEEDLE (DISP) 22G X 1-1/2"" MISC": 1.0000 | 1 refills | Status: DC

## 2018-11-06 MED ORDER — SYRINGE (DISPOSABLE) 1 ML MISC: 1.0000 | 0 refills | Status: DC

## 2018-11-06 NOTE — Telephone Encounter (Signed)
Schedule physical

## 2018-11-06 NOTE — Telephone Encounter (Signed)
Is this ok to refill?  

## 2018-11-07 ENCOUNTER — Other Ambulatory Visit: Payer: Self-pay

## 2018-11-07 ENCOUNTER — Other Ambulatory Visit: Payer: Self-pay | Admitting: Medical

## 2018-11-07 NOTE — Telephone Encounter (Signed)
Is this ok to refill?  

## 2018-11-17 ENCOUNTER — Telehealth: Payer: Self-pay | Admitting: Medical

## 2018-11-17 NOTE — Telephone Encounter (Signed)
P.A. TESTOSTERONE CYPIONATE  

## 2018-11-18 NOTE — Telephone Encounter (Signed)
P.A. approved til 11/07/21, left message for pt  °

## 2019-01-19 ENCOUNTER — Other Ambulatory Visit: Payer: Self-pay

## 2019-01-19 DIAGNOSIS — Z20822 Contact with and (suspected) exposure to covid-19: Secondary | ICD-10-CM

## 2019-01-24 LAB — NOVEL CORONAVIRUS, NAA: SARS-CoV-2, NAA: NOT DETECTED

## 2019-02-03 ENCOUNTER — Other Ambulatory Visit: Payer: Self-pay | Admitting: Medical

## 2019-02-05 ENCOUNTER — Encounter: Payer: Self-pay | Admitting: Medical

## 2019-02-05 ENCOUNTER — Other Ambulatory Visit: Payer: Self-pay

## 2019-02-05 ENCOUNTER — Ambulatory Visit: Payer: BC Managed Care – PPO | Admitting: Medical

## 2019-02-05 VITALS — BP 120/80 | HR 80 | Temp 98.6°F | Resp 16 | Ht 69.5 in | Wt 228.6 lb

## 2019-02-05 DIAGNOSIS — E291 Testicular hypofunction: Secondary | ICD-10-CM

## 2019-02-05 DIAGNOSIS — K219 Gastro-esophageal reflux disease without esophagitis: Secondary | ICD-10-CM

## 2019-02-05 DIAGNOSIS — K200 Eosinophilic esophagitis: Secondary | ICD-10-CM

## 2019-02-05 DIAGNOSIS — Z0000 Encounter for general adult medical examination without abnormal findings: Secondary | ICD-10-CM

## 2019-02-05 DIAGNOSIS — R7989 Other specified abnormal findings of blood chemistry: Secondary | ICD-10-CM

## 2019-02-05 DIAGNOSIS — E559 Vitamin D deficiency, unspecified: Secondary | ICD-10-CM

## 2019-02-05 DIAGNOSIS — L989 Disorder of the skin and subcutaneous tissue, unspecified: Secondary | ICD-10-CM

## 2019-02-05 LAB — POCT URINALYSIS DIP (PROADVANTAGE DEVICE)
Bilirubin, UA: NEGATIVE
Blood, UA: NEGATIVE
Glucose, UA: NEGATIVE mg/dL
Ketones, POC UA: NEGATIVE mg/dL
Leukocytes, UA: NEGATIVE
Nitrite, UA: NEGATIVE
Protein Ur, POC: NEGATIVE mg/dL
Specific Gravity, Urine: 1.005
Urobilinogen, Ur: NEGATIVE
pH, UA: 7.5 (ref 5.0–8.0)

## 2019-02-05 NOTE — Progress Notes (Signed)
Subjective:   HPI  Peter AltesBrandon Mauceri is a 28 y.o. male who presents for Chief Complaint  Patient presents with  . CPE    fasting CPE eye exam this month    Patient Care Team: Khiley Lieser, Kermit Baloavid S, PA-C as PCP - General (Family Medicine) Sees dentist Sees eye doctor  Concerns: none  Reviewed their medical, surgical, family, social, medication, and allergy history and updated chart as appropriate.  Past Medical History:  Diagnosis Date  . Allergy   . Asthma    childhood  . Eosinophilic esophagitis    [facility] 08/2016  . GERD (gastroesophageal reflux disease)   . Hypertension 2017  . Hypogonadism in male   . Low testosterone 2016  . Wears glasses     Past Surgical History:  Procedure Laterality Date  . ESOPHAGOGASTRODUODENOSCOPY  08/2016    Social History   Socioeconomic History  . Marital status: Single    Spouse name: Not on file  . Number of children: Not on file  . Years of education: Not on file  . Highest education level: Not on file  Occupational History  . Not on file  Social Needs  . Financial resource strain: Not on file  . Food insecurity    Worry: Not on file    Inability: Not on file  . Transportation needs    Medical: Not on file    Non-medical: Not on file  Tobacco Use  . Smoking status: Never Smoker  . Smokeless tobacco: Former Engineer, [employer]  Substance and Sexual Activity  . Alcohol use: Yes    Comment: 1 beer on weekend  . Drug use: No  . Sexual activity: Not on file  Lifestyle  . Physical activity    Days per week: Not on file    Minutes per session: Not on file  . Stress: Not on file  Relationships  . Social Musicianconnections    Talks on phone: Not on file    Gets together: Not on file    Attends religious service: Not on file    Active member of club or organization: Not on file    Attends meetings of clubs or organizations: Not on file    Relationship status: Not on file  . Intimate partner violence    Fear of current or ex partner: Not on  file    Emotionally abused: Not on file    Physically abused: Not on file    Forced sexual activity: Not on file  Other Topics Concern  . Not on file  Social History Narrative   Married 11/2016.  No children.  Counsellorlectrician apprentice with [employer], exercises regularly, body building, walking, running.   02/2019.    Family History  Problem Relation Age of Onset  . Kidney disease Mother   . Hypertension Father   . Hypertension Paternal Grandfather   . Heart disease Neg Hx   . Stroke Neg Hx   . Diabetes Neg Hx   . Cancer Neg Hx      Current Outpatient Medications:  .  cholecalciferol (VITAMIN D) 1000 units tablet, Take 1,000 Units by mouth daily. Reported on 12/09/2015, Disp: , Rfl:  .  montelukast (SINGULAIR) 10 MG tablet, Take 1 tablet (10 mg total) by mouth at bedtime., Disp: 90 tablet, Rfl: 3 .  NEEDLE, DISP, 22 G (BD DISP NEEDLES) 22G X 1-1/2" MISC, 1 each by Does not apply route every 14 (fourteen) days. Change needle before injection, Disp: 100 each, Rfl: 1 .  Syringe, Disposable, 1 ML MISC, 1 each by Does not apply route every 14 (fourteen) days., Disp: 100 each, Rfl: 0 .  testosterone cypionate (DEPOTESTOSTERONE CYPIONATE) 200 MG/ML injection, INJECT 1 ML IN THE MUSCLE EVERY 14 DAYS, Disp: 10 mL, Rfl: 0  Allergies  Allergen Reactions  . Omeprazole     Face swelling.  Tolerates Ranitidine  . Penicillins   . Proton Pump Inhibitors        Review of Systems Constitutional: -fever, -chills, -sweats, -unexpected weight change, -decreased appetite, -fatigue Allergy: -sneezing, -itching, -congestion Dermatology: -changing moles, --rash, -lumps ENT: -runny nose, -ear pain, -sore throat, -hoarseness, -sinus pain, -teeth pain, - ringing in ears, -hearing loss, -nosebleeds Cardiology: -chest pain, -palpitations, -swelling, -difficulty breathing when lying flat, -waking up short of breath Respiratory: -cough, -shortness of breath, -difficulty breathing with exercise or exertion,  -wheezing, -coughing up blood Gastroenterology: -abdominal pain, -nausea, -vomiting, -diarrhea, -constipation, -blood in stool, -changes in bowel movement, -difficulty swallowing or eating Hematology: -bleeding, -bruising  Musculoskeletal: -joint aches, -muscle aches, -joint swelling, -back pain, -neck pain, -cramping, -changes in gait Ophthalmology: denies vision changes, eye redness, itching, discharge Urology: -burning with urination, -difficulty urinating, -blood in urine, -urinary frequency, -urgency, -incontinence Neurology: -headache, -weakness, -tingling, -numbness, -memory loss, -falls, -dizziness Psychology: -depressed mood, -agitation, -sleep problems Male GU: no testicular mass, pain, no lymph nodes swollen, no swelling, no rash.     Objective:  BP 120/80   Pulse 80   Temp 98.6 F (37 C) (Temporal)   Resp 16   Ht 5' 9.5" (1.765 m)   Wt 228 lb 9.6 oz (103.7 kg)   SpO2 98%   BMI 33.27 kg/m   General appearance: alert, no distress, WD/WN, Caucasian male Skin: scattered freckles, mid upper back midline with 3mm somewhat asymmetric red lesion slightly raised with slightly irregular border, no other worrisome lesions HEENT: normocephalic, conjunctiva/corneas normal, sclerae anicteric, PERRLA, EOMi, nares patent, no discharge or erythema, pharynx normal Oral cavity: MMM, tongue normal, teeth normal Neck: supple, no lymphadenopathy, no thyromegaly, no masses, normal ROM, no bruits Chest: non tender, normal shape and expansion Heart: RRR, normal S1, S2, no murmurs Lungs: CTA bilaterally, no wheezes, rhonchi, or rales Abdomen: +bs, soft, non tender, non distended, no masses, no hepatomegaly, no splenomegaly, no bruits Back: non tender, normal ROM, no scoliosis Musculoskeletal: upper extremities non tender, no obvious deformity, normal ROM throughout, lower extremities non tender, no obvious deformity, normal ROM throughout Extremities: no edema, no cyanosis, no clubbing Pulses:  2+ symmetric, upper and lower extremities, normal cap refill Neurological: alert, oriented x 3, CN2-12 intact, strength normal upper extremities and lower extremities, sensation normal throughout, DTRs 2+ throughout, no cerebellar signs, gait normal Psychiatric: normal affect, behavior normal, pleasant  GU: normal male external genitalia,circumcised, nontender, no masses, no hernia, no lymphadenopathy Rectal: declined   Assessment and Plan :   Encounter Diagnoses  Name Primary?  . Routine general medical examination at a health care facility Yes  . Eosinophilic esophagitis   . Gastroesophageal reflux disease without esophagitis   . Hypogonadism in male   . Low testosterone   . Vitamin D deficiency   . Skin lesion     Physical exam - discussed and counseled on healthy lifestyle, diet, exercise, preventative care, vaccinations, sick and well care, proper use of emergency dept and after hours care, and addressed their concerns.    Health screening: See your eye doctor yearly for routine vision care. See your dentist yearly for routine dental care including hygiene  visits twice yearly.  Cancer screening Advised monthly self testicular exam  Discussed PSA, prostate exam, and prostate cancer screening risks/benefits.   Done mainly due to testosterone therapy  Vaccinations: Advised yearly influenza vaccine Up to date on Td vaccine  Acute issues discussed: none  Separate significant chronic issues discussed: Low testosterone - he wants to change to gel therapy.  He will call insurer about covered options  GERD - improved with apple cider vinegar.  BP improved today compared to prior visit.  Been exercising more.  Skin lesion - he will monitor with home serial pictures and measures x 1-2 months.  Consider dermatology consult  Tullio was seen today for cpe.  Diagnoses and all orders for this visit:  Routine general medical examination at a health care facility -     POCT  Urinalysis DIP (Proadvantage Device) -     Comprehensive metabolic panel -     Lipid panel -     CBC with Differential/Platelet -     PSA -     Testosterone -     VITAMIN D 25 Hydroxy (Vit-D Deficiency, Fractures)  Eosinophilic esophagitis  Gastroesophageal reflux disease without esophagitis  Hypogonadism in male -     PSA -     Testosterone  Low testosterone -     PSA -     Testosterone  Vitamin D deficiency -     VITAMIN D 25 Hydroxy (Vit-D Deficiency, Fractures)  Skin lesion    Follow-up pending labs, yearly for physical

## 2019-02-05 NOTE — Patient Instructions (Addendum)
Please call your insurer and ask for their preferred first choice and second choice or 1st and second tier drugs for testosterone topical gels.   Options for testosterone therapy includes: Testim gel Fortesta gel Androgel Axiron gel  Testosterone nasal spray  Testosterone oral tablet    Thanks for trusting us with your health care and for coming in for a physical today.  Below are some general recommendations I have for you:  Yearly screenings See your eye doctor yearly for routine vision care. See your dentist yearly for routine dental care including hygiene visits twice yearly. See me here yearly for a routine physical and preventative care visit   Specific Concerns today:  .    Please follow up yearly for a physical.   Preventative Care for Adults - Male      MAINTAIN REGULAR HEALTH EXAMS:  A routine yearly physical is a good way to check in with your primary care provider about your health and preventive screening. It is also an opportunity to share updates about your health and any concerns you have, and receive a thorough all-over exam.   Most health insurance companies pay for at least some preventative services.  Check with your health plan for specific coverages.  WHAT PREVENTATIVE SERVICES DO MEN NEED?  Adult men should have their weight and blood pressure checked regularly.   Men age 27 and older should have their cholesterol levels checked regularly.  Beginning at age 27 and continuing to age 27, men should be screened for colorectal cancer.  Certain people may need continued testing until age 10285.  Updating vaccinations is part of preventative care.  Vaccinations help protect against diseases such as the flu.  Osteoporosis is a disease in which the bones lose minerals and strength as we age. Men ages 7265 and over should discuss this with their caregivers  Lab tests are generally done as part of preventative care to screen for anemia and blood disorders,  to screen for problems with the kidneys and liver, to screen for bladder problems, to check blood sugar, and to check your cholesterol level.  Preventative services generally include counseling about diet, exercise, avoiding tobacco, drugs, excessive alcohol consumption, and sexually transmitted infections.    GENERAL RECOMMENDATIONS FOR GOOD HEALTH:  Healthy diet:  Eat a variety of foods, including fruit, vegetables, animal or vegetable protein, such as meat, fish, chicken, and eggs, or beans, lentils, tofu, and grains, such as rice.  Drink plenty of water daily.  Decrease saturated fat in the diet, avoid lots of red meat, processed foods, sweets, fast foods, and fried foods.  Exercise:  Aerobic exercise helps maintain good heart health. At least 30-40 minutes of moderate-intensity exercise is recommended. For example, a brisk walk that increases your heart rate and breathing. This should be done on most days of the week.   Find a type of exercise or a variety of exercises that you enjoy so that it becomes a part of your daily life.  Examples are running, walking, swimming, water aerobics, and biking.  For motivation and support, explore group exercise such as aerobic class, spin class, Zumba, Yoga,or  martial arts, etc.    Set exercise goals for yourself, such as a certain weight goal, walk or run in a race such as a 5k walk/run.  Speak to your primary care provider about exercise goals.  Disease prevention:  If you smoke or chew tobacco, find out from your caregiver how to quit. It can literally save your  life, no matter how long you have been a tobacco user. If you do not use tobacco, never begin.   Maintain a healthy diet and normal weight. Increased weight leads to problems with blood pressure and diabetes.   The Body Mass Index or BMI is a way of measuring how much of your body is fat. Having a BMI above 27 increases the risk of heart disease, diabetes, hypertension, stroke and  other problems related to obesity. Your caregiver can help determine your BMI and based on it develop an exercise and dietary program to help you achieve or maintain this important measurement at a healthful level.  High blood pressure causes heart and blood vessel problems.  Persistent high blood pressure should be treated with medicine if weight loss and exercise do not work.   Fat and cholesterol leaves deposits in your arteries that can [place] them. This causes heart disease and vessel disease elsewhere in your body.  If your cholesterol is found to be high, or if you have heart disease or certain other medical conditions, then you may need to have your cholesterol monitored frequently and be treated with medication.   Ask if you should have a cardiac stress test if your history suggests this. A stress test is a test done on a treadmill that looks for heart disease. This test can find disease prior to there being a problem.  Osteoporosis is a disease in which the bones lose minerals and strength as we age. This can result in serious bone fractures. Risk of osteoporosis can be identified using a bone density scan. Men ages 2065 and over should discuss this with their caregivers. Ask your caregiver whether you should be taking a calcium supplement and Vitamin D, to reduce the rate of osteoporosis.   Avoid drinking alcohol in excess (more than two drinks per day).  Avoid use of street drugs. Do not share needles with anyone. Ask for professional help if you need assistance or instructions on stopping the use of alcohol, cigarettes, and/or drugs.  Brush your teeth twice a day with fluoride toothpaste, and floss once a day. Good oral hygiene prevents tooth decay and gum disease. The problems can be painful, unattractive, and can cause other health problems. Visit your dentist for a routine oral and dental check up and preventive care every 6-12 months.   Look at your skin regularly.  Use a mirror to look  at your back. Notify your caregivers of changes in moles, especially if there are changes in shapes, colors, a size larger than a pencil eraser, an irregular border, or development of new moles.  Safety:  Use seatbelts 100% of the time, whether driving or as a passenger.  Use safety devices such as hearing protection if you work in environments with loud noise or significant background noise.  Use safety glasses when doing any work that could send debris in to the eyes.  Use a helmet if you ride a bike or motorcycle.  Use appropriate safety gear for contact sports.  Talk to your caregiver about gun safety.  Use sunscreen with a SPF (or skin protection factor) of 15 or greater.  Lighter skinned people are at a greater risk of skin cancer. Don't forget to also wear sunglasses in order to protect your eyes from too much damaging sunlight. Damaging sunlight can accelerate cataract formation.   Practice safe sex. Use condoms. Condoms are used for birth control and to help reduce the spread of sexually transmitted infections (or STIs).  Some of the STIs are gonorrhea (the clap), chlamydia, syphilis, trichomonas, herpes, HPV (human papilloma virus) and HIV (human immunodeficiency virus) which causes AIDS. The herpes, HIV and HPV are viral illnesses that have no cure. These can result in disability, cancer and death.   Keep carbon monoxide and smoke detectors in your home functioning at all times. Change the batteries every 6 months or use a model that plugs into the wall.   Vaccinations:  Stay up to date with your tetanus shots and other required immunizations. You should have a booster for tetanus every 10 years. Be sure to get your flu shot every year, since 5%-20% of the [place] population comes down with the flu. The flu vaccine changes each year, so being vaccinated once is not enough. Get your shot in the fall, before the flu season peaks.   Other vaccines to consider:  Human Papilloma Virus or HPV  causes cancer of the cervix, and other infections that can be transmitted from person to person. There is a vaccine for HPV, and males should get immunized between the ages of 58 and 74. It requires a series of 3 shots.   Pneumococcal vaccine to protect against certain types of pneumonia.  This is normally recommended for adults age 39 or older.  However, adults younger than 27 years old with certain underlying conditions such as diabetes, heart or lung disease should also receive the vaccine.  Shingles vaccine to protect against Varicella Zoster if you are older than age 62, or younger than 27 years old with certain underlying illness.  If you have not had the Shingrix vaccine, please call your insurer to inquire about coverage for the Shingrix vaccine given in 2 doses.   Some insurers cover this vaccine after age 41, some cover this after age 66.  If your insurer covers this, then call to schedule appointment to have this vaccine here  Hepatitis A vaccine to protect against a form of infection of the liver by a virus acquired from food.  Hepatitis B vaccine to protect against a form of infection of the liver by a virus acquired from blood or body fluids, particularly if you work in health care.  If you plan to travel internationally, check with your local health department for specific vaccination recommendations.   What should I know about cancer screening? Many types of cancers can be detected early and may often be prevented. Lung Cancer  You should be screened every year for lung cancer if: ? You are a current smoker who has smoked for at least 30 years. ? You are a former smoker who has quit within the past 15 years.  Talk to your health care provider about your screening options, when you should start screening, and how often you should be screened.  Colorectal Cancer  Routine colorectal cancer screening usually begins at 27 years of age and should be repeated every 5-10 years until  you are 27 years old. You may need to be screened more often if early forms of precancerous polyps or small growths are found. Your health care provider may recommend screening at an earlier age if you have risk factors for colon cancer.  Your health care provider may recommend using home test kits to check for hidden blood in the stool.  A small camera at the end of a tube can be used to examine your colon (sigmoidoscopy or colonoscopy). This checks for the earliest forms of colorectal cancer.  Prostate and Testicular Cancer  Depending on your age and overall health, your health care provider may do certain tests to screen for prostate and testicular cancer.  Talk to your health care provider about any symptoms or concerns you have about testicular or prostate cancer.  Skin Cancer  Check your skin from head to toe regularly.  Tell your health care provider about any new moles or changes in moles, especially if: ? There is a change in a mole's size, shape, or color. ? You have a mole that is larger than a pencil eraser.  Always use sunscreen. Apply sunscreen liberally and repeat throughout the day.  Protect yourself by wearing long sleeves, pants, a wide-brimmed hat, and sunglasses when outside.

## 2019-02-06 LAB — CBC WITH DIFFERENTIAL/PLATELET
Basophils Absolute: 0 10*3/uL (ref 0.0–0.2)
Basos: 0 %
EOS (ABSOLUTE): 0.2 10*3/uL (ref 0.0–0.4)
Eos: 3 %
Hematocrit: 52.3 % — ABNORMAL HIGH (ref 37.5–51.0)
Hemoglobin: 17.6 g/dL (ref 13.0–17.7)
Immature Grans (Abs): 0 10*3/uL (ref 0.0–0.1)
Immature Granulocytes: 0 %
Lymphocytes Absolute: 1.7 10*3/uL (ref 0.7–3.1)
Lymphs: 35 %
MCH: 28.2 pg (ref 26.6–33.0)
MCHC: 33.7 g/dL (ref 31.5–35.7)
MCV: 84 fL (ref 79–97)
Monocytes Absolute: 0.4 10*3/uL (ref 0.1–0.9)
Monocytes: 9 %
Neutrophils Absolute: 2.6 10*3/uL (ref 1.4–7.0)
Neutrophils: 53 %
Platelets: 220 10*3/uL (ref 150–450)
RBC: 6.25 x10E6/uL — ABNORMAL HIGH (ref 4.14–5.80)
RDW: 13.2 % (ref 11.6–15.4)
WBC: 4.9 10*3/uL (ref 3.4–10.8)

## 2019-02-06 LAB — LIPID PANEL
Chol/HDL Ratio: 3.6 ratio (ref 0.0–5.0)
Cholesterol, Total: 147 mg/dL (ref 100–199)
HDL: 41 mg/dL (ref >39)
LDL Calculated: 90 mg/dL (ref 0–99)
Triglycerides: 78 mg/dL (ref 0–149)
VLDL Cholesterol Cal: 16 mg/dL (ref 5–40)

## 2019-02-06 LAB — COMPREHENSIVE METABOLIC PANEL
ALT: 33 IU/L (ref 0–44)
AST: 21 IU/L (ref 0–40)
Albumin/Globulin Ratio: 1.7 (ref 1.2–2.2)
Albumin: 5 g/dL (ref 4.1–5.2)
Alkaline Phosphatase: 97 IU/L (ref 39–117)
BUN/Creatinine Ratio: 9 (ref 9–20)
BUN: 10 mg/dL (ref 6–20)
Bilirubin Total: 1.2 mg/dL (ref 0.0–1.2)
CO2: 26 mmol/L (ref 20–29)
Calcium: 10.1 mg/dL (ref 8.7–10.2)
Chloride: 100 mmol/L (ref 96–106)
Creatinine, Ser: 1.13 mg/dL (ref 0.76–1.27)
GFR calc Af Amer: 102 mL/min/{1.73_m2} (ref >59)
GFR calc non Af Amer: 89 mL/min/{1.73_m2} (ref >59)
Globulin, Total: 3 g/dL (ref 1.5–4.5)
Glucose: 98 mg/dL (ref 65–99)
Potassium: 4.2 mmol/L (ref 3.5–5.2)
Sodium: 140 mmol/L (ref 134–144)
Total Protein: 8 g/dL (ref 6.0–8.5)

## 2019-02-06 LAB — TESTOSTERONE: Testosterone: 977 ng/dL — ABNORMAL HIGH (ref 264–916)

## 2019-02-06 LAB — VITAMIN D 25 HYDROXY (VIT D DEFICIENCY, FRACTURES): Vit D, 25-Hydroxy: 44.9 ng/mL (ref 30.0–100.0)

## 2019-02-06 LAB — PSA: Prostate Specific Ag, Serum: 1 ng/mL (ref 0.0–4.0)

## 2019-02-07 ENCOUNTER — Other Ambulatory Visit: Payer: Self-pay | Admitting: Medical

## 2019-02-07 MED ORDER — TESTOSTERONE 20.25 MG/1.25GM (1.62%) TD GEL: 2.0000 | Freq: Every day | TRANSDERMAL | 5 refills | Status: DC

## 2019-02-09 ENCOUNTER — Telehealth: Payer: Self-pay | Admitting: Medical

## 2019-02-09 NOTE — Telephone Encounter (Signed)
P.A. TESTOSTERONE °

## 2019-02-10 NOTE — Telephone Encounter (Signed)
P.A. approved til 02/08/2022

## 2019-02-13 NOTE — Telephone Encounter (Signed)
Called pharmacy Androgel went thru for $45 & called pt and informed.  He states was only supposed to cost $2. He will call his insurance.  He also wants to know when he should start the Androgel , his last injection was 01/31/19 and he was doing injections every 3 weeks.  Please advise?

## 2019-02-13 NOTE — Telephone Encounter (Signed)
He can start it when he gets it in

## 2019-02-23 NOTE — Telephone Encounter (Signed)
Pt is staying with injections due to cost

## 2019-05-14 ENCOUNTER — Other Ambulatory Visit: Payer: Self-pay | Admitting: Medical

## 2019-06-26 ENCOUNTER — Other Ambulatory Visit: Payer: Self-pay

## 2019-06-26 ENCOUNTER — Other Ambulatory Visit: Payer: Self-pay | Admitting: Medical

## 2019-06-26 MED ORDER — TESTOSTERONE CYPIONATE 200 MG/ML IM SOLN: INTRAMUSCULAR | 0 refills | Status: DC

## 2019-06-27 NOTE — Telephone Encounter (Signed)
Pt is requesting to have testosterone filled at [place]. Please advise [facility]

## 2019-06-28 ENCOUNTER — Other Ambulatory Visit: Payer: Self-pay | Admitting: Medical

## 2019-06-28 MED ORDER — TESTOSTERONE CYPIONATE 200 MG/ML IM SOLN: INTRAMUSCULAR | 0 refills | Status: DC

## 2019-09-29 ENCOUNTER — Other Ambulatory Visit: Payer: Self-pay | Admitting: Medical

## 2019-10-01 NOTE — Telephone Encounter (Signed)
I will let Peter Chase determine follow up and refill of his testosterone. It looks like he has not had his testosterone checked since August. Please let him know.

## 2019-10-01 NOTE — Telephone Encounter (Signed)
[facility] is requesting to fill pt testosterone. Please advise [facility] 

## 2019-10-03 ENCOUNTER — Telehealth: Payer: Self-pay | Admitting: Medical

## 2019-10-03 NOTE — Telephone Encounter (Signed)
Peter Chase please determine refill on med

## 2019-10-03 NOTE — Telephone Encounter (Signed)
Pt wants to know if there is someone that specializes  in testosterone. Pt wants to you if you will send him in 1 more refill until he can find another PCP pt can be reached at [phone] informed pt that you was out of the office until Tuesday

## 2019-10-03 NOTE — Telephone Encounter (Signed)
Tried to call pt but could not leave a VM.

## 2019-10-03 NOTE — Telephone Encounter (Signed)
Pt called and is wanting to know if you know of any dr in [place] pt has moved, and would like to know if you can recommend and one for him pt can be reached at [phone]

## 2019-10-08 ENCOUNTER — Other Ambulatory Visit: Payer: Self-pay | Admitting: Medical

## 2019-10-08 MED ORDER — TESTOSTERONE CYPIONATE 200 MG/ML IM SOLN: INTRAMUSCULAR | 0 refills | Status: AC

## 2019-10-08 NOTE — Telephone Encounter (Signed)
I sent in a last refill.  I believe he moved to [place].  If that is the case, then yes he needs to go ahead and establish with new PCP for future refills.  I don't know a specific specialist in [place] but he would want to see Urology

## 2019-10-09 NOTE — Telephone Encounter (Signed)
Called and informed pt.  

## 2022-03-10 ENCOUNTER — Encounter: Payer: Self-pay | Admitting: Internal Medicine
